# Patient Record
Sex: Male | Born: 1965 | Race: White | Hispanic: No | Marital: Married | Smoking: Current every day smoker
Health system: Southern US, Academic
[De-identification: ages and names within clinical notes are randomized; demographics above are authoritative.]

## PROBLEM LIST (undated history)

## (undated) DIAGNOSIS — E7800 Pure hypercholesterolemia, unspecified: Secondary | ICD-10-CM

## (undated) DIAGNOSIS — M4800 Spinal stenosis, site unspecified: Secondary | ICD-10-CM

## (undated) DIAGNOSIS — I10 Essential (primary) hypertension: Secondary | ICD-10-CM

## (undated) DIAGNOSIS — M129 Arthropathy, unspecified: Secondary | ICD-10-CM

## (undated) DIAGNOSIS — D66 Hereditary factor VIII deficiency: Secondary | ICD-10-CM

## (undated) HISTORY — DX: Essential (primary) hypertension: I10

## (undated) HISTORY — DX: Arthropathy, unspecified: M12.9

## (undated) HISTORY — DX: Pure hypercholesterolemia, unspecified: E78.00

## (undated) HISTORY — DX: Hereditary factor VIII deficiency (CMS HCC): D66

## (undated) HISTORY — DX: Spinal stenosis, site unspecified: M48.00

## (undated) HISTORY — PX: KNEE ARTHROSCOPY: SUR90

---

## 1998-06-29 DIAGNOSIS — C801 Malignant (primary) neoplasm, unspecified: Secondary | ICD-10-CM

## 1998-06-29 HISTORY — DX: Malignant (primary) neoplasm, unspecified (CMS HCC): C80.1

## 2000-06-15 ENCOUNTER — Other Ambulatory Visit (HOSPITAL_COMMUNITY): Payer: Self-pay | Admitting: Hematology & Oncology

## 2020-06-20 IMAGING — MR MRI CERVICAL SPINE WITHOUT CONTRAST
4 of 5 series · 24 of 48 positions shown · IV contrast (gadolinium)
Comparison: None.

﻿EXAM:  MRI CERVICAL SPINE WITHOUT CONTRAST
INDICATION: Posterior neck pain and headaches.
TECHNIQUE: Multiplanar, multisequential MRI of the cervical spine was performed without gadolinium contrast.

[Series 5: T2 · sagittal · 3.0mm · 0.75mm/px · 8 of 15 slices shown (1 of 2)]
[im 1/15]
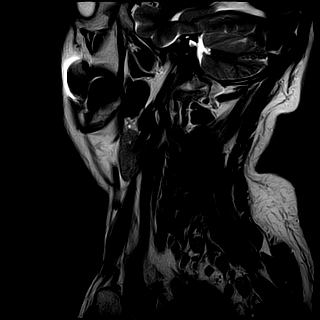
[im 3/15]
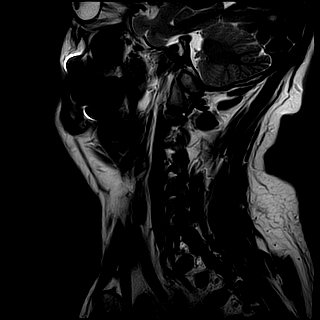
[im 5/15]
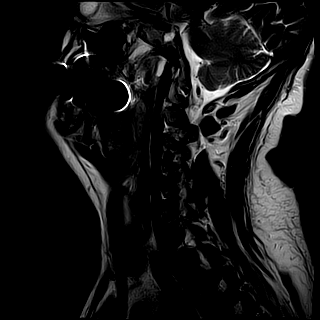
[im 7/15]
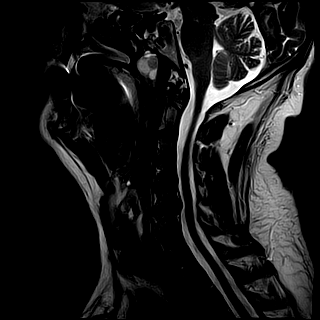
[im 9/15]
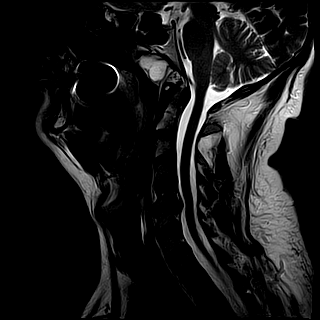
[im 11/15]
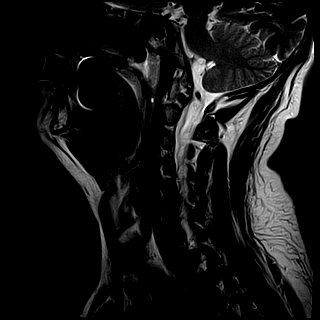
[im 13/15]
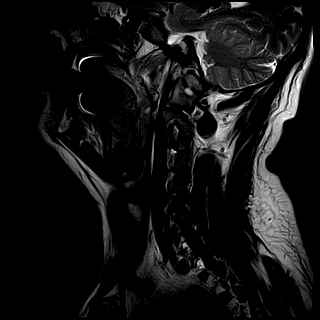
[im 15/15]
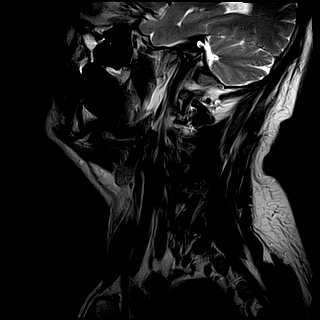

[Series 6: T1 · sagittal · 3.0mm · 0.47mm/px · 3 of 15 slices shown]
[im 2/15]
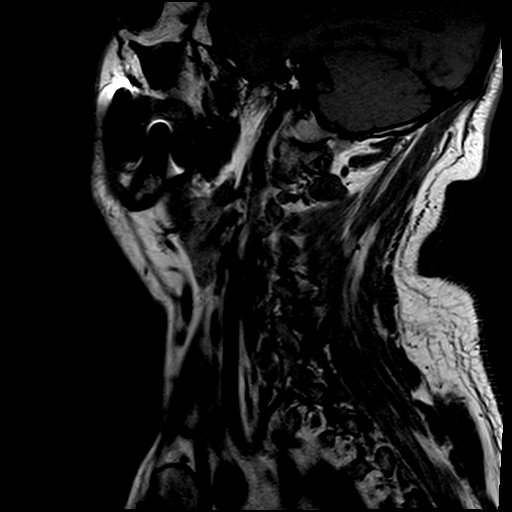
[im 8/15]
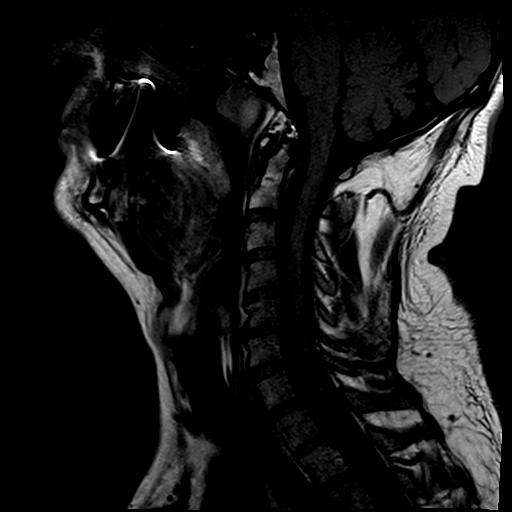
[im 13/15]
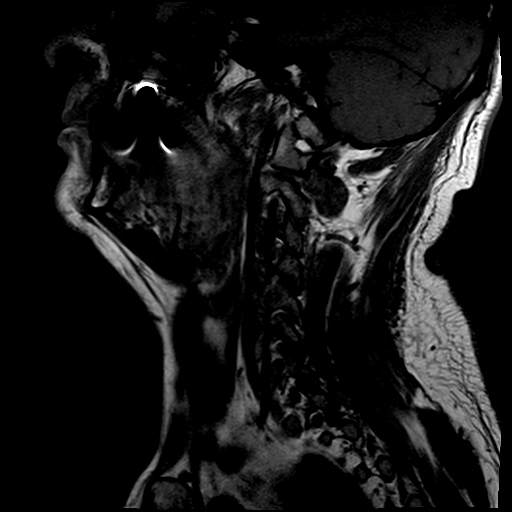

[Series 8: STIR · sagittal · 3.0mm · 0.47mm/px · 3 of 15 slices shown]
[im 2/15]
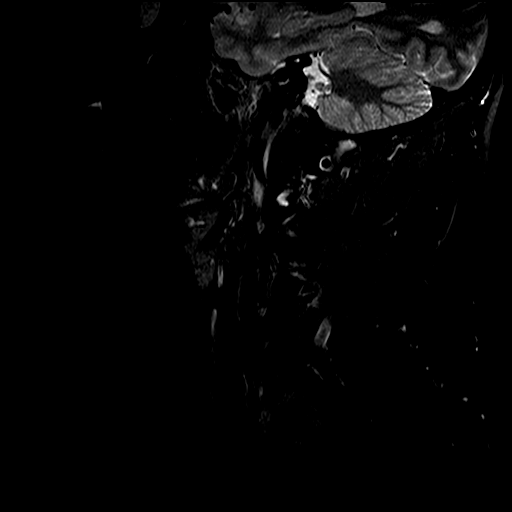
[im 8/15]
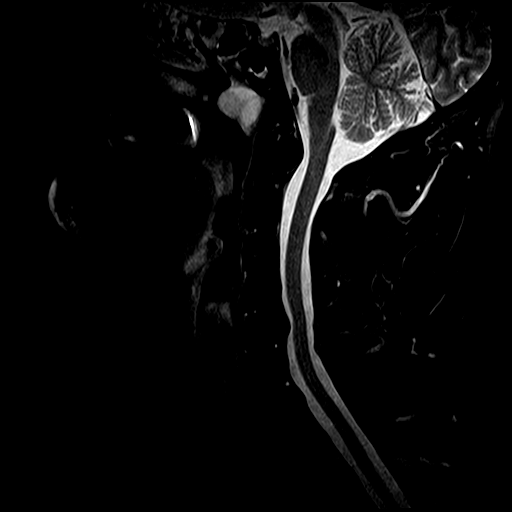
[im 13/15]
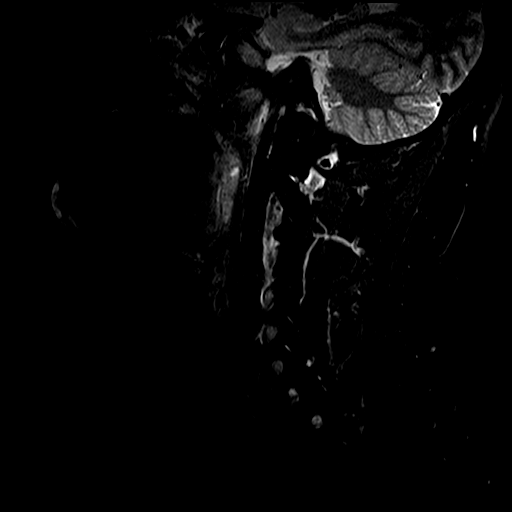

[Series 10: T2 · axial · 3.0mm · 0.39mm/px · z∈[-117,+4]mm · 10 of 18 slices shown (2 of 2)]
[im 1/18]
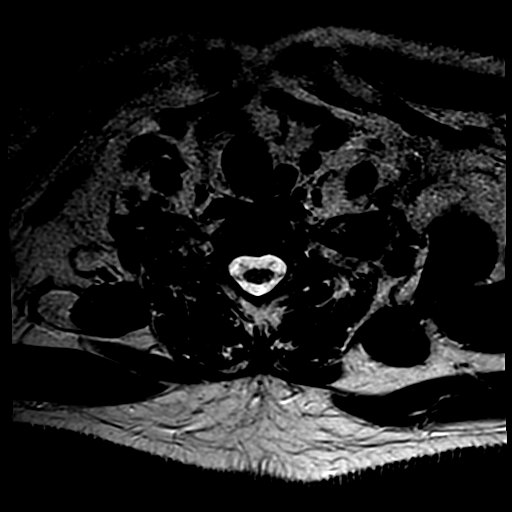
[im 2/18]
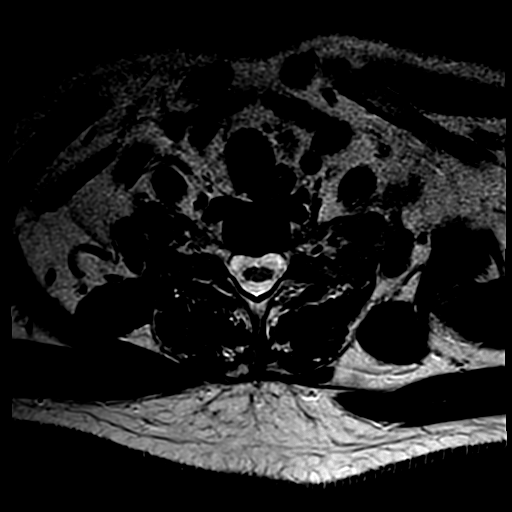
[im 4/18]
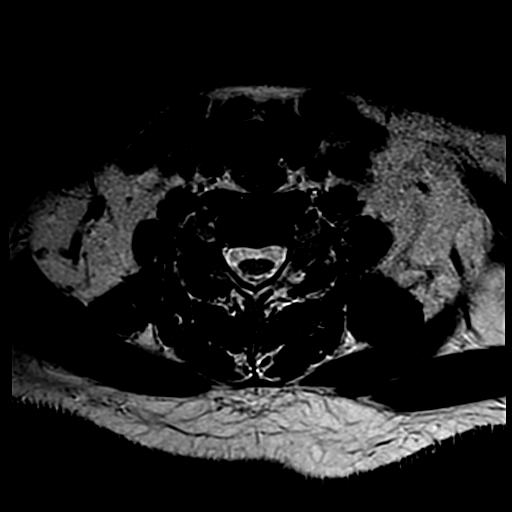
[im 6/18]
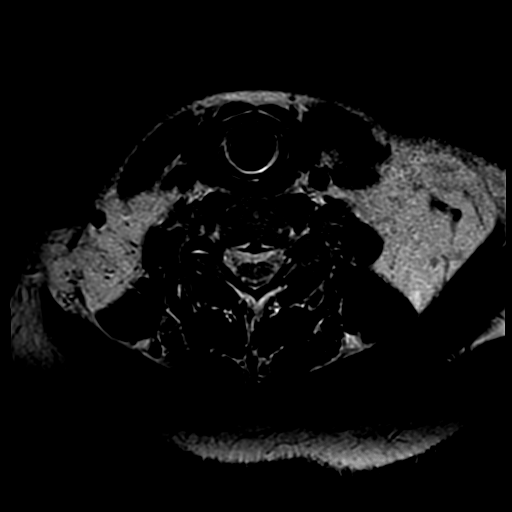
[im 7/18]
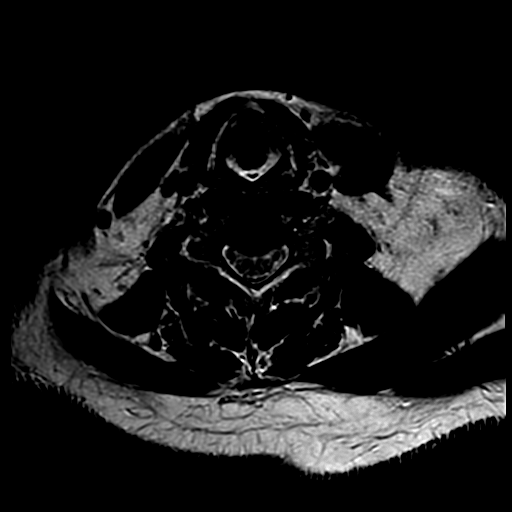
[im 9/18]
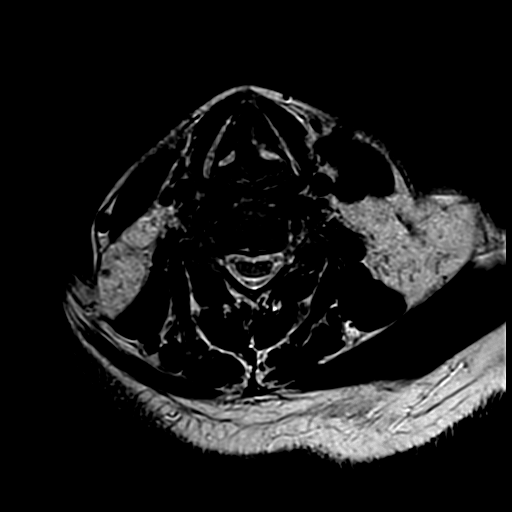
[im 11/18]
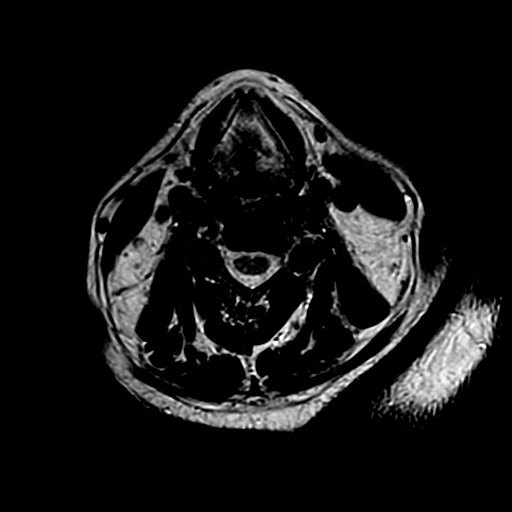
[im 12/18]
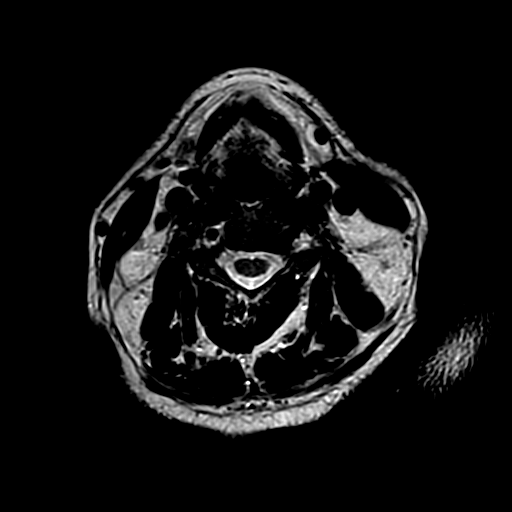
[im 14/18]
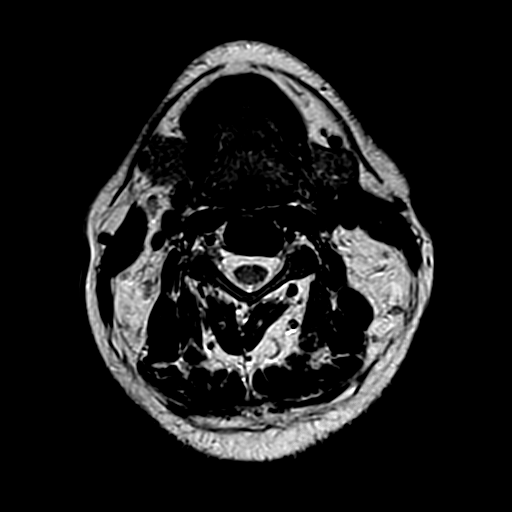
[im 16/18]
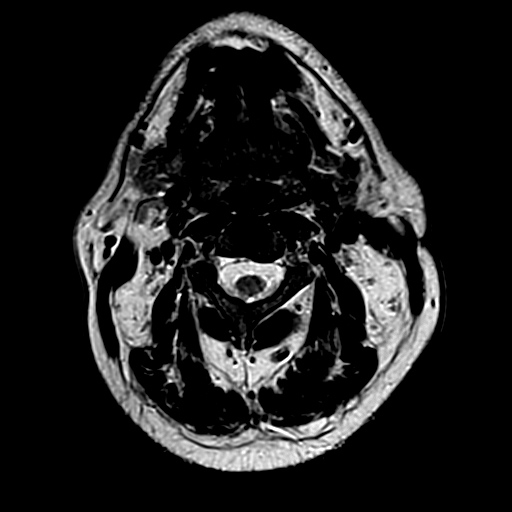

[24 of 48 positions shown; findings below may reference images not displayed]

FINDINGS: Vertebral bodies are normal in height, alignment and signal intensity.  There is no acute fracture or subluxation.  Visualized spinal cord is normal in signal intensity without evidence of compression at any level.  

C2-C3 level is unremarkable.

At C3-C4 level, there is mild left neural foraminal stenosis from facet and uncovertebral joint hypertrophy.  

At C4-C5 level, there is moderate to severe left neural foraminal stenosis from facet and uncovertebral joint hypertrophy. 

At C5-C6 level, there is a small broad-based central disc osteophyte complex partially effacing the ventral CSF.  There is moderate to severe right neural foraminal stenosis from uncovertebral joint hypertrophy. 

C6-C7, C7-T1 and paraspinal soft tissues are unremarkable.
IMPRESSION: 1. Small central disc osteophyte complex at C5-C6 level partially effacing the ventral CSF.

2. Multilevel neural foraminal stenosis as detailed above.

## 2021-07-02 ENCOUNTER — Other Ambulatory Visit (HOSPITAL_COMMUNITY): Payer: Self-pay | Admitting: NURSE PRACTITIONER

## 2021-07-02 DIAGNOSIS — R079 Chest pain, unspecified: Secondary | ICD-10-CM

## 2021-10-15 IMAGING — MR MRI KNEE RT W/O CONTRAST
4 of 5 series · 25 of 40 positions shown · non-contrast
Comparison: None.

﻿EXAM:  [id number]   MRI KNEE RT W/O CONTRAST
INDICATION: Right knee mass or cyst on the lateral side.
TECHNIQUE: Noncontrast multiplanar, multisequence MRI was performed.

[Series 6: PD fat-sat · sagittal · right · 3.0mm · 0.31mm/px · 8 of 30 slices shown (1 of 3)]
[im 1/30]
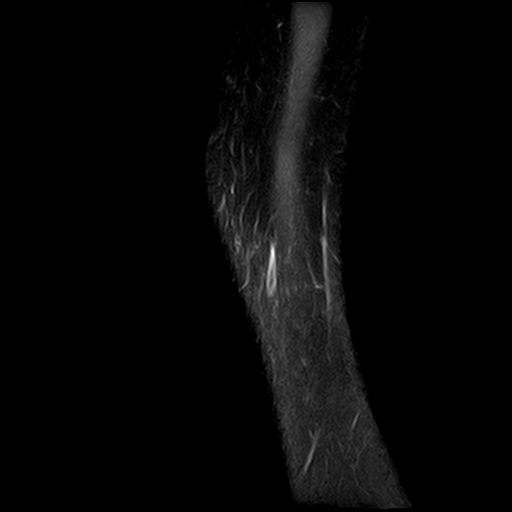
[im 5/30]
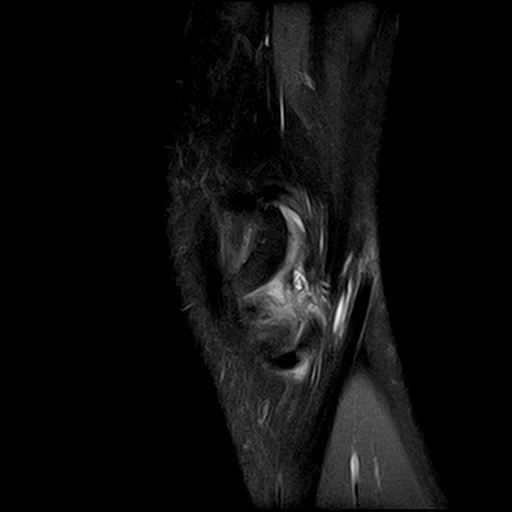
[im 9/30]
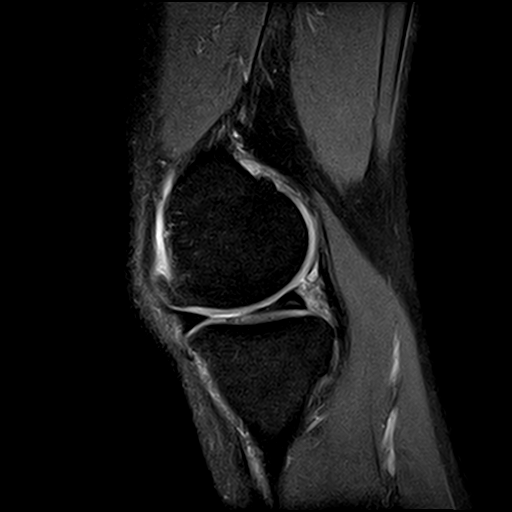
[im 13/30]
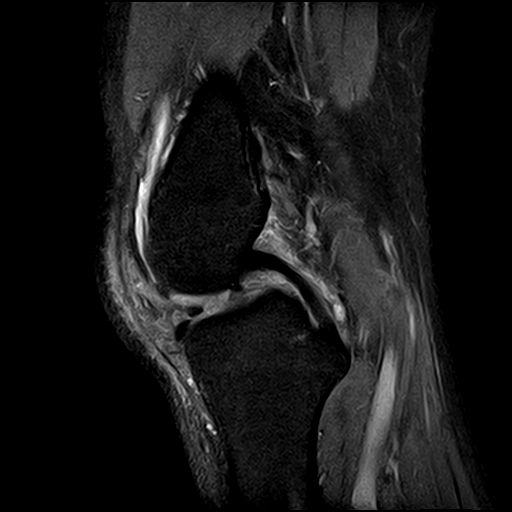
[im 17/30]
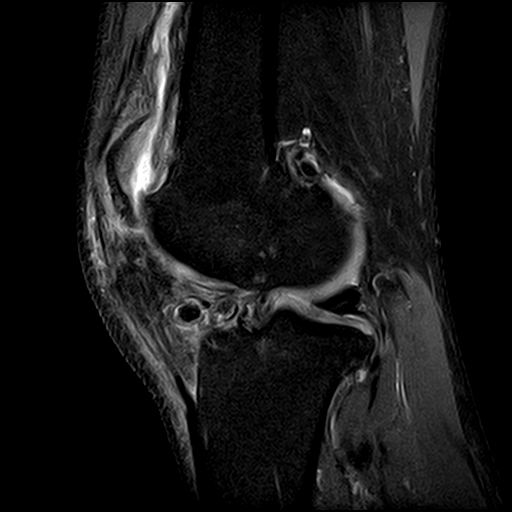
[im 21/30]
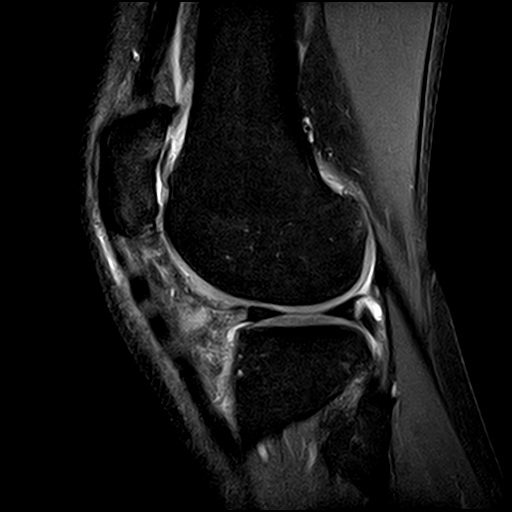
[im 25/30]
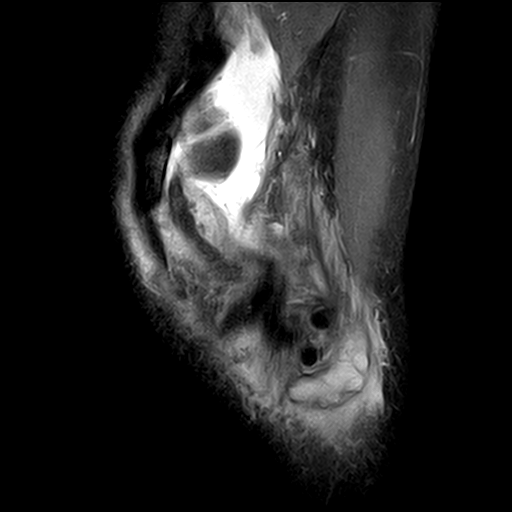
[im 30/30]
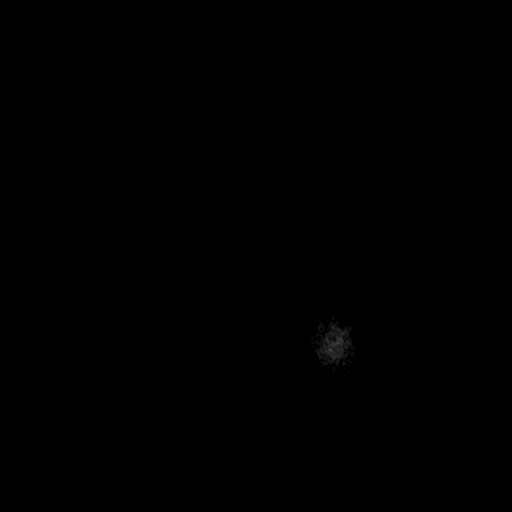

[Series 7: T1 · sagittal · right · 3.0mm · 0.31mm/px · 3 of 30 slices shown]
[im 5/30]
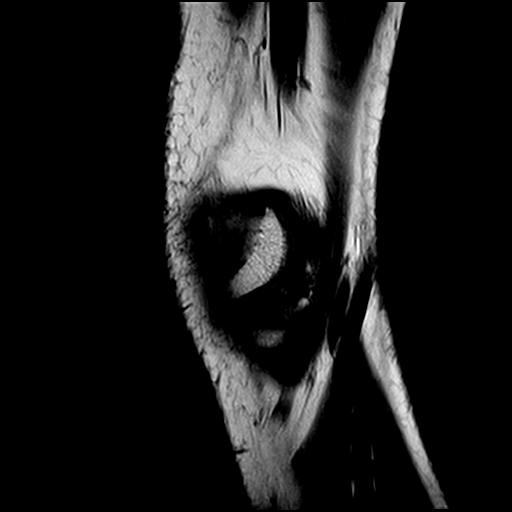
[im 17/30]
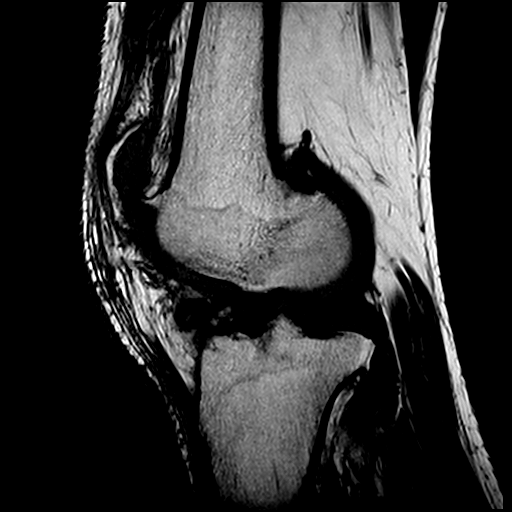
[im 25/30]
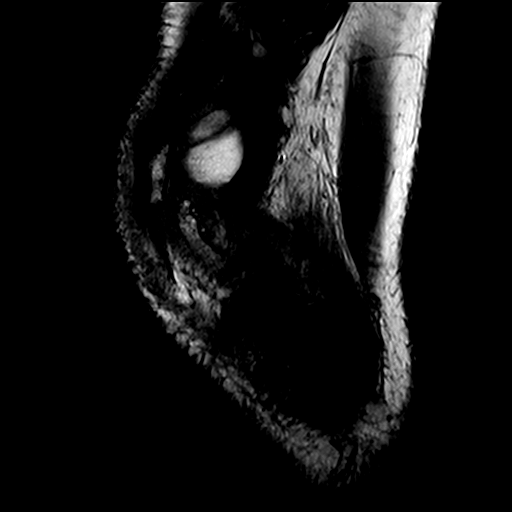

[Series 9: PD fat-sat · coronal · right · 3.0mm · 0.36mm/px · 8 of 27 slices shown (2 of 3)]
[im 1/27]
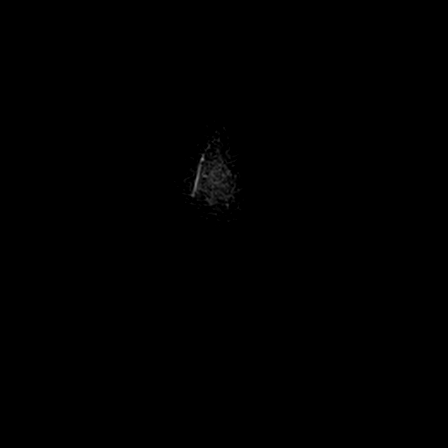
[im 4/27]
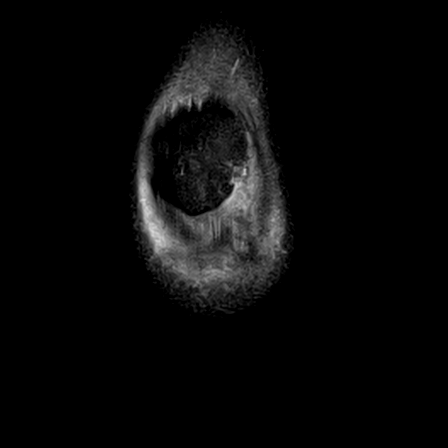
[im 8/27]
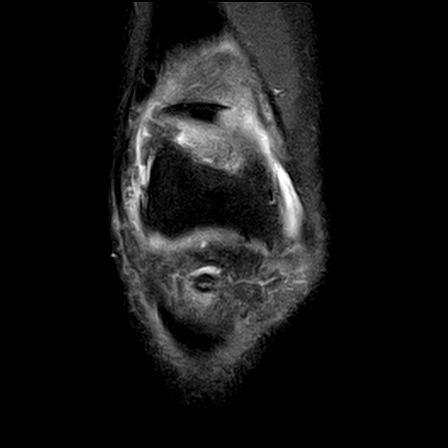
[im 12/27]
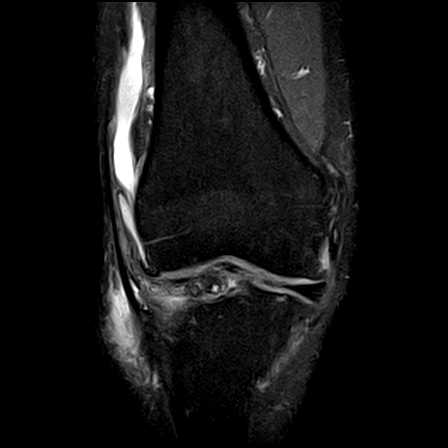
[im 15/27]
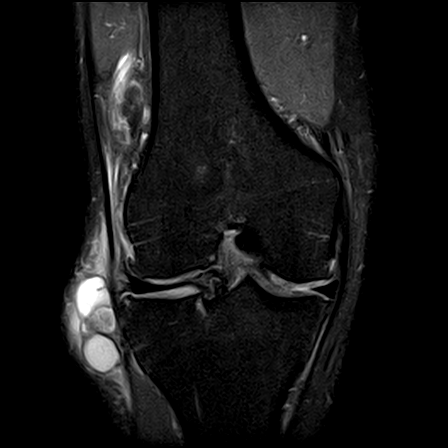
[im 19/27]
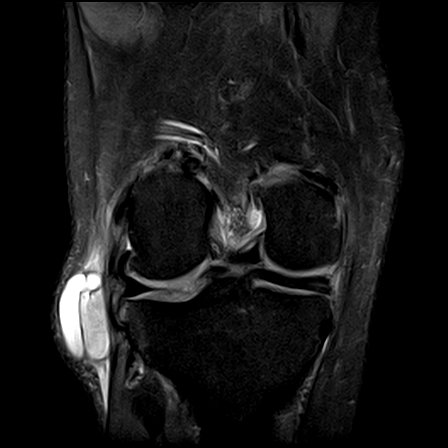
[im 23/27]
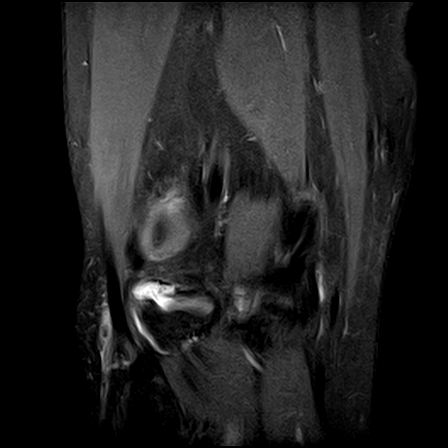
[im 27/27]
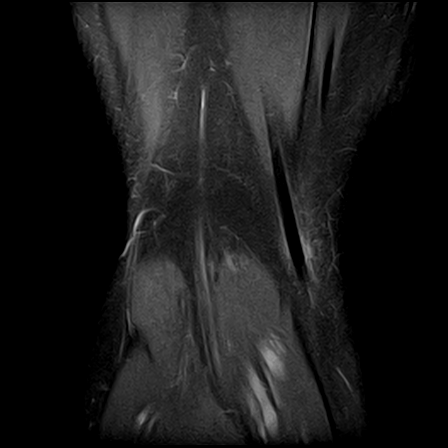

[Series 10: PD fat-sat · axial · right · 4.0mm · 0.59mm/px · z∈[-89,+19]mm · 6 of 30 slices shown (3 of 3)]
[im 1/30]
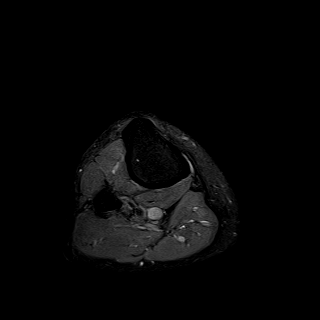
[im 5/30]
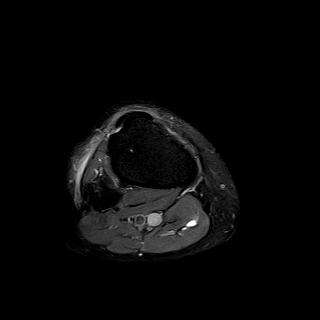
[im 9/30]
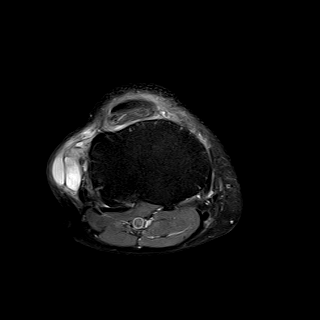
[im 13/30]
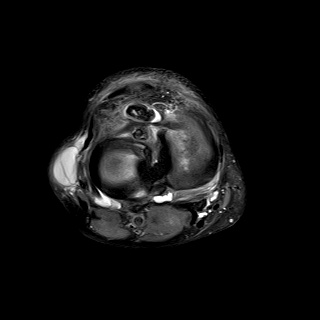
[im 17/30]
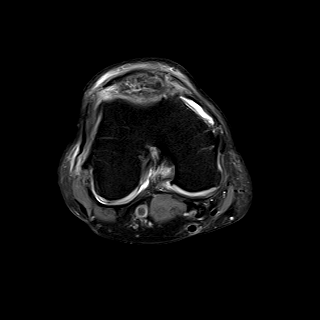
[im 25/30]
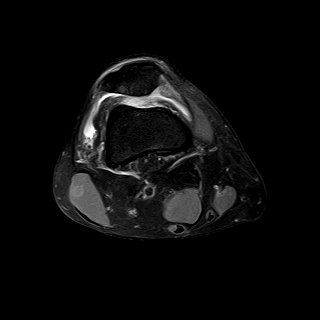

[25 of 40 positions shown; findings below may reference images not displayed]

FINDINGS: There appear to be multiple small osseous loose bodies suggesting synovial osteochondromatosis.  This could be confirmed with plain film or CT examination as indicated.  There is a small to moderate-sized joint effusion. 

There is a 4 cm multiloculated lateral cyst.  There is a small amount of hemorrhage or debris within some of the locules.  This may represent a ganglion cyst.

No definite meniscal tear is seen. The anterior and posterior cruciate ligaments appear intact.  The medial collateral ligament appears intact.  The lateral collateral ligament is partially obscured.  There is mild extensor tendinosis.

There are moderate degenerative changes. There is moderate chondromalacia patella.

No fracture, dislocation, or significant marrow signal alteration is seen.
IMPRESSION: 1. Synovial osteochondromatosis. 

2. Lateral ganglion cyst. 

3. Moderate osteoarthritis.

## 2021-10-30 ENCOUNTER — Other Ambulatory Visit (HOSPITAL_COMMUNITY): Payer: Self-pay | Admitting: NURSE PRACTITIONER

## 2021-10-30 DIAGNOSIS — D0007 Carcinoma in situ of tongue: Secondary | ICD-10-CM

## 2021-11-06 ENCOUNTER — Inpatient Hospital Stay (HOSPITAL_COMMUNITY)
Admission: RE | Admit: 2021-11-06 | Discharge: 2021-11-06 | Disposition: A | Discharge status: Home / Self Care | Payer: MEDICAID | Source: Ambulatory Visit

## 2021-11-06 ENCOUNTER — Inpatient Hospital Stay
Admission: RE | Admit: 2021-11-06 | Discharge: 2021-11-06 | Disposition: A | Discharge status: Home / Self Care | Payer: MEDICAID | Source: Ambulatory Visit | Attending: NURSE PRACTITIONER | Admitting: NURSE PRACTITIONER

## 2021-11-06 ENCOUNTER — Other Ambulatory Visit: Payer: Self-pay

## 2021-11-06 DIAGNOSIS — D0007 Carcinoma in situ of tongue: Secondary | ICD-10-CM | POA: Insufficient documentation

## 2021-11-10 ENCOUNTER — Other Ambulatory Visit: Payer: Self-pay

## 2022-02-09 ENCOUNTER — Inpatient Hospital Stay (HOSPITAL_COMMUNITY): Payer: MEDICAID

## 2022-02-09 ENCOUNTER — Observation Stay
Admission: EM | Admit: 2022-02-09 | Discharge: 2022-02-10 | Disposition: A | Discharge status: Home / Self Care | Payer: MEDICAID | Attending: Internal Medicine | Admitting: Internal Medicine

## 2022-02-09 ENCOUNTER — Observation Stay (HOSPITAL_COMMUNITY): Payer: MEDICAID | Admitting: Internal Medicine

## 2022-02-09 ENCOUNTER — Encounter (HOSPITAL_COMMUNITY): Payer: Self-pay

## 2022-02-09 ENCOUNTER — Other Ambulatory Visit: Payer: Self-pay

## 2022-02-09 DIAGNOSIS — R0789 Other chest pain: Principal | ICD-10-CM | POA: Insufficient documentation

## 2022-02-09 DIAGNOSIS — Z79899 Other long term (current) drug therapy: Secondary | ICD-10-CM | POA: Insufficient documentation

## 2022-02-09 DIAGNOSIS — Z7901 Long term (current) use of anticoagulants: Secondary | ICD-10-CM | POA: Insufficient documentation

## 2022-02-09 DIAGNOSIS — E785 Hyperlipidemia, unspecified: Secondary | ICD-10-CM

## 2022-02-09 DIAGNOSIS — F17210 Nicotine dependence, cigarettes, uncomplicated: Secondary | ICD-10-CM

## 2022-02-09 DIAGNOSIS — D6851 Activated protein C resistance: Secondary | ICD-10-CM | POA: Insufficient documentation

## 2022-02-09 DIAGNOSIS — R0600 Dyspnea, unspecified: Secondary | ICD-10-CM

## 2022-02-09 DIAGNOSIS — R61 Generalized hyperhidrosis: Secondary | ICD-10-CM

## 2022-02-09 DIAGNOSIS — Z8249 Family history of ischemic heart disease and other diseases of the circulatory system: Secondary | ICD-10-CM | POA: Insufficient documentation

## 2022-02-09 DIAGNOSIS — I10 Essential (primary) hypertension: Secondary | ICD-10-CM | POA: Insufficient documentation

## 2022-02-09 DIAGNOSIS — R079 Chest pain, unspecified: Secondary | ICD-10-CM | POA: Diagnosis present

## 2022-02-09 DIAGNOSIS — E7800 Pure hypercholesterolemia, unspecified: Secondary | ICD-10-CM | POA: Insufficient documentation

## 2022-02-09 LAB — COMPREHENSIVE METABOLIC PANEL, NON-FASTING
ALBUMIN/GLOBULIN RATIO: 1.7 — ABNORMAL HIGH (ref 0.8–1.4)
ALBUMIN: 4.8 g/dL (ref 3.5–5.7)
ALKALINE PHOSPHATASE: 43 U/L (ref 34–104)
ALT (SGPT): 20 U/L (ref 7–52)
ANION GAP: 8 mmol/L — ABNORMAL LOW (ref 10–20)
AST (SGOT): 19 U/L (ref 13–39)
BILIRUBIN TOTAL: 0.5 mg/dL (ref 0.3–1.2)
BUN/CREA RATIO: 12 (ref 6–22)
BUN: 11 mg/dL (ref 7–25)
CALCIUM, CORRECTED: 9.3 mg/dL (ref 8.9–10.8)
CALCIUM: 10.1 mg/dL (ref 8.6–10.3)
CHLORIDE: 101 mmol/L (ref 98–107)
CO2 TOTAL: 29 mmol/L (ref 21–31)
CREATININE: 0.95 mg/dL (ref 0.60–1.30)
ESTIMATED GFR: 95 mL/min/{1.73_m2} (ref >59)
GLOBULIN: 2.9 (ref 2.9–5.4)
GLUCOSE: 128 mg/dL — ABNORMAL HIGH (ref 74–109)
OSMOLALITY, CALCULATED: 277 mOsm/kg (ref 270–290)
POTASSIUM: 4 mmol/L (ref 3.5–5.1)
PROTEIN TOTAL: 7.7 g/dL (ref 6.4–8.9)
SODIUM: 138 mmol/L (ref 136–145)

## 2022-02-09 LAB — LIPID PANEL
CHOL/HDL RATIO: 6.3
CHOLESTEROL: 326 mg/dL — ABNORMAL HIGH (ref <200)
HDL CHOL: 52 mg/dL (ref 23–92)
LDL CALC: 194 mg/dL — ABNORMAL HIGH (ref 0–100)
TRIGLYCERIDES: 398 mg/dL — ABNORMAL HIGH (ref <=150)
VLDL CALC: 80 mg/dL — ABNORMAL HIGH (ref 0–50)

## 2022-02-09 LAB — CBC WITH DIFF
BASOPHIL #: 0.1 10*3/uL (ref 0.00–0.30)
BASOPHIL %: 1 % (ref 0–3)
EOSINOPHIL #: 0.1 10*3/uL (ref 0.00–0.80)
EOSINOPHIL %: 1 % (ref 0–7)
HCT: 48.7 % (ref 42.0–51.0)
HGB: 16.9 g/dL (ref 13.5–18.0)
LYMPHOCYTE #: 2.8 10*3/uL (ref 1.10–5.00)
LYMPHOCYTE %: 32 % (ref 25–45)
MCH: 32.6 pg — ABNORMAL HIGH (ref 27.0–32.0)
MCHC: 34.7 g/dL (ref 32.0–36.0)
MCV: 93.9 fL (ref 78.0–99.0)
MONOCYTE #: 0.7 10*3/uL (ref 0.00–1.30)
MONOCYTE %: 9 % (ref 0–12)
MPV: 7.3 fL — ABNORMAL LOW (ref 7.4–10.4)
NEUTROPHIL #: 5 10*3/uL (ref 1.80–8.40)
NEUTROPHIL %: 58 % (ref 40–76)
PLATELETS: 354 10*3/uL (ref 140–440)
RBC: 5.18 10*6/uL (ref 4.20–6.00)
RDW: 13.2 % (ref 11.6–14.8)
WBC: 8.7 10*3/uL (ref 4.0–10.5)
WBCS UNCORRECTED: 8.7 10*3/uL

## 2022-02-09 LAB — B-TYPE NATRIURETIC PEPTIDE: BNP: <25 pg/mL (ref 5–100)

## 2022-02-09 LAB — GRAY TOP TUBE

## 2022-02-09 LAB — MAGNESIUM: MAGNESIUM: 2 mg/dL (ref 1.9–2.7)

## 2022-02-09 LAB — PT/INR
INR: 1.1 (ref <=5.00)
PROTHROMBIN TIME: 12.7 seconds (ref 9.8–12.7)

## 2022-02-09 LAB — TROPONIN-I
TROPONIN I: 4 ng/L (ref <20)
TROPONIN I: 4 ng/L (ref <20)
TROPONIN I: 4 ng/L (ref <20)
TROPONIN I: 4 ng/L (ref <20)
TROPONIN I: 4 ng/L (ref <20)

## 2022-02-09 LAB — PTT (PARTIAL THROMBOPLASTIN TIME): APTT: 37.3 seconds — ABNORMAL HIGH (ref 26.0–36.0)

## 2022-02-09 LAB — THYROID STIMULATING HORMONE (SENSITIVE TSH): TSH: 1.204 u[IU]/mL (ref 0.450–5.330)

## 2022-02-09 MED ORDER — ONDANSETRON HCL (PF) 4 MG/2 ML INJECTION SOLUTION
4.0000 mg | Freq: Four times a day (QID) | INTRAMUSCULAR | Status: DC | PRN
Start: 2022-02-09 — End: 2022-02-10

## 2022-02-09 MED ORDER — CYCLOBENZAPRINE 10 MG TABLET
5.0000 mg | ORAL_TABLET | Freq: Two times a day (BID) | ORAL | Status: DC | PRN
Start: 2022-02-09 — End: 2022-02-10

## 2022-02-09 MED ORDER — ENALAPRIL MALEATE 5 MG TABLET
20.0000 mg | ORAL_TABLET | Freq: Every day | ORAL | Status: DC
Start: 2022-02-09 — End: 2022-02-10
  Administered 2022-02-09 – 2022-02-10 (×2): 20 mg via ORAL
  Filled 2022-02-09: qty 4

## 2022-02-09 MED ORDER — APIXABAN 5 MG TABLET
ORAL_TABLET | ORAL | Status: AC
Start: 2022-02-09 — End: 2022-02-09
  Filled 2022-02-09: qty 1

## 2022-02-09 MED ORDER — ASPIRIN 81 MG CHEWABLE TABLET
81.0000 mg | CHEWABLE_TABLET | Freq: Every day | ORAL | Status: DC
Start: 2022-02-10 — End: 2022-02-10
  Administered 2022-02-10: 81 mg via ORAL
  Filled 2022-02-09: qty 1

## 2022-02-09 MED ORDER — PANTOPRAZOLE 40 MG TABLET,DELAYED RELEASE
DELAYED_RELEASE_TABLET | ORAL | Status: AC
Start: 2022-02-09 — End: 2022-02-09
  Filled 2022-02-09: qty 1

## 2022-02-09 MED ORDER — ACETAMINOPHEN 325 MG TABLET
650.0000 mg | ORAL_TABLET | ORAL | Status: DC | PRN
Start: 2022-02-09 — End: 2022-02-10

## 2022-02-09 MED ORDER — ASPIRIN 81 MG CHEWABLE TABLET
324.0000 mg | CHEWABLE_TABLET | ORAL | Status: DC
Start: 2022-02-09 — End: 2022-02-09

## 2022-02-09 MED ORDER — PREGABALIN 150 MG CAPSULE
ORAL_CAPSULE | ORAL | Status: AC
Start: 2022-02-09 — End: 2022-02-09
  Filled 2022-02-09: qty 1

## 2022-02-09 MED ORDER — NICOTINE 14 MG/24 HR DAILY TRANSDERMAL PATCH
14.0000 mg | MEDICATED_PATCH | Freq: Every day | TRANSDERMAL | Status: DC
Start: 2022-02-09 — End: 2022-02-10
  Administered 2022-02-09 – 2022-02-10 (×2): 14 mg via TRANSDERMAL
  Filled 2022-02-09 (×3): qty 1

## 2022-02-09 MED ORDER — ASPIRIN 81 MG CHEWABLE TABLET
CHEWABLE_TABLET | ORAL | Status: AC
Start: 2022-02-09 — End: 2022-02-09
  Filled 2022-02-09: qty 4

## 2022-02-09 MED ORDER — APIXABAN 5 MG TABLET
5.0000 mg | ORAL_TABLET | Freq: Every day | ORAL | Status: DC
Start: 2022-02-09 — End: 2022-02-10
  Administered 2022-02-09 – 2022-02-10 (×2): 5 mg via ORAL
  Filled 2022-02-09: qty 1

## 2022-02-09 MED ORDER — MORPHINE 2 MG/ML INJECTION WRAPPER
2.0000 mg | INJECTION | INTRAMUSCULAR | Status: DC | PRN
Start: 2022-02-09 — End: 2022-02-10

## 2022-02-09 MED ORDER — ASPIRIN 81 MG CHEWABLE TABLET
162.0000 mg | CHEWABLE_TABLET | ORAL | Status: AC
Start: 2022-02-09 — End: 2022-02-09
  Administered 2022-02-09: 162 mg via ORAL

## 2022-02-09 MED ORDER — PANTOPRAZOLE 40 MG TABLET,DELAYED RELEASE
40.0000 mg | DELAYED_RELEASE_TABLET | Freq: Every day | ORAL | Status: DC
Start: 2022-02-09 — End: 2022-02-10
  Administered 2022-02-09 – 2022-02-10 (×2): 40 mg via ORAL
  Filled 2022-02-09: qty 1

## 2022-02-09 MED ORDER — PREGABALIN 150 MG CAPSULE
150.0000 mg | ORAL_CAPSULE | Freq: Two times a day (BID) | ORAL | Status: DC
Start: 2022-02-09 — End: 2022-02-10
  Administered 2022-02-09 – 2022-02-10 (×2): 150 mg via ORAL
  Filled 2022-02-09: qty 1

## 2022-02-09 MED ORDER — NITROGLYCERIN 2 % TRANSDERMAL OINTMENT - PACKET
TOPICAL_OINTMENT | TRANSDERMAL | Status: AC
Start: 2022-02-09 — End: 2022-02-09
  Filled 2022-02-09: qty 1

## 2022-02-09 MED ORDER — ENALAPRIL MALEATE 5 MG TABLET
ORAL_TABLET | ORAL | Status: AC
Start: 2022-02-09 — End: 2022-02-09
  Filled 2022-02-09: qty 4

## 2022-02-09 MED ORDER — NITROGLYCERIN 2 % TRANSDERMAL OINTMENT - PACKET
1.0000 [in_us] | TOPICAL_OINTMENT | TRANSDERMAL | Status: AC
Start: 2022-02-09 — End: 2022-02-09
  Administered 2022-02-09: 1 [in_us] via TOPICAL

## 2022-02-09 NOTE — ED Triage Notes (Signed)
Was awakened by chest pain at 0330 today. States she was covered in sweat. States he felt like heartbeat was irregular and coming out of his chest. Was short of breath.

## 2022-02-09 NOTE — ED APP Handoff Note (Signed)
[facility]  Emergency Department  Provider in Triage Note    Name: Antonio Shields  Age: 56 y.o.  Gender: male     Subjective:   Antonio Shields is a 57 y.o. male who presents with complaint of Chest Pain   .  Patient here with chest pain, started in the middle of the night. Associated symptoms of diaphoresis, palpitations, dyspnea, dizziness. Denies any cardiac history or surgery.     Objective:   Filed Vitals:    02/09/22 1253   BP: (!) 139/98   Pulse: 77   Resp: 19   Temp: 36.6 C (97.9 F)   SpO2: 100%      Focused Physical Exam shows patient sitting in chair, no distress noted. BP slightly elevated 139/98    Assessment:  A medical screening exam was completed.  This patient is a 56 y.o. male with initial findings showing chest pain    Plan:  Please see initial orders and work-up below.  This is to be continued with full evaluation in the main Emergency Department.     No current facility-administered medications for this encounter.     No results found for this or any previous visit (from the past 24 hour(s)).     Hewitt Shorts, FNP-BC  02/09/2022, 12:52

## 2022-02-09 NOTE — H&P (Signed)
[facility]  Hospitalist  History and Physical    Barret, Esquivel  Date of Admission:  02/09/2022  Date of Birth:  1966/03/27  PCP: Haze Rushing, NP      REASON FOR ADMISSION: Chest Pain    HPI: Pt arrives in the Er today after having chest pain on/off x 3 months. He states he had moderate chest pressure that woke him up this AM with diaphoresis and left arm numbness that lasted about 10 min. Trops are 4, 4 and 4. He states chest pressure has happened with and without exertion. Nothing helps or makes worse. He denies any other sx. His father did have MI at 59, also. He has history of HTN, hyperlipidemia and Factor V disorder and does smoke 1 pk daily x 40 years. He is on chronic Eliquis.       ROS: Full review of systems performed negative with exception of pertinent negatives and positives mentioned in HPI.      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    Congenital factor VIII disorder (CMS HCC)     High cholesterol     HTN (hypertension)     Spinal stenosis              Past Surgical History:   Procedure Laterality Date    KNEE ARTHROSCOPY Right              Medications Prior to Admission       Prescriptions    aspirin (ECOTRIN) 81 mg Oral Tablet, Delayed Release (E.C.)    Take 1 Tablet (81 mg total) by mouth Once a day    cyclobenzaprine (FLEXERIL) 5 mg Oral Tablet    Twice per day as needed    ELIQUIS 5 mg Oral Tablet    Take 1 Tablet (5 mg total) by mouth Once a day    enalapril (VASOTEC) 20 mg Oral Tablet    Take 1 Tablet (20 mg total) by mouth Once a day    fenofibrate micronized (LOFIBRA) 134 mg Oral Capsule    Take 1 Capsule (134 mg total) by mouth Once a day    fluticasone propionate (FLONASE) 50 mcg/actuation Nasal Spray, Suspension    Administer 2 Sprays into each nostril Twice per day as needed    multivit-minerals/folic acid (CENTRUM ADULTS ORAL)    Take 1 Tablet by mouth Once a day    pantoprazole (PROTONIX) 40 mg Oral  Tablet, Delayed Release (E.C.)    Take 1 Tablet (40 mg total) by mouth Once a day    pregabalin (LYRICA) 150 mg Oral Capsule    Take 1 Capsule (150 mg total) by mouth Twice daily    vitamin B complex Oral Tablet    Take 1 Tablet by mouth Once a day              Allergies   Allergen Reactions    Ciprofloxacin Rash    Flagyl [Metronidazole] Hives/ Urticaria       Social History     Tobacco Use    Smoking status: Every Day     Packs/day: 1.00     Types: Cigarettes    Smokeless tobacco: Never   Substance Use Topics    Alcohol use: Never    Drug use: Never       Family History:      PHYSICAL EXAM:  Physical Exam  HENT:      Mouth/Throat:      Mouth: Mucous membranes are moist.   Eyes:      Pupils: Pupils are equal, round, and reactive to light.   Cardiovascular:      Rate and Rhythm: Normal rate and regular rhythm.      Pulses: Normal pulses.      Heart sounds: Normal heart sounds.   Pulmonary:      Effort: Pulmonary effort is normal.      Breath sounds: Normal breath sounds.   Abdominal:      General: Bowel sounds are normal.      Palpations: Abdomen is soft.   Skin:     General: Skin is warm and dry.   Neurological:      Mental Status: He is alert and oriented to person, place, and time.         VITALS:  Temperature: 36.6 C (97.9 F)  Heart Rate: 66  BP (Non-Invasive): (!) 141/95  Respiratory Rate: (!) 10  SpO2: 97 %        Labs:    I have reviewed all lab results.    Imaging Studies:    XR CHEST PA AND LATERAL   Final Result   NO ACUTE FINDINGS.         Radiologist location ID: [id number]             Assessment/Plan:       Active Hospital Problems   (*Primary Problem)    Diagnosis    *Chest pain    Factor V Leiden (CMS HCC)     Chronic    HTN (hypertension)     Chronic    High cholesterol     Chronic     -Chest pain  Consult cardiology. Nitro and Mso4 as needed. Echo ordered and tele monitor.     -Factor V Leiden  Continue home Eliquis     -HTN  Resume home BP meds    -Hight cholesterol  Resume statin    The  Hospitalist personally evaluated and examined the patient in conjunction with the MLP and agree with the assessments, treatment plan and disposition of the patient as recorded by the [facility].       Angelina Pih, NP-C

## 2022-02-09 NOTE — ED Nurses Note (Signed)
Patient received to room, placed on monitor, assessment and history completed. Call light in hand, encouraged to call for any needs. Awaiting providers orders at this time.

## 2022-02-09 NOTE — ED Nurses Note (Signed)
Report called to 2W at this time.

## 2022-02-09 NOTE — ED Provider Notes (Signed)
[facility]  [place] [id number]  [phone]    Triage:  Chest Pain     Most Recent Vitals    Flowsheet Row ED from 02/09/2022 in [facility]   Temperature 36.6 C (97.9 F) filed at... 02/09/2022 1253   Heart Rate 77 filed at... 02/09/2022 1253   Respiratory Rate 19 filed at... 02/09/2022 1253   BP (Non-Invasive) 139/98 filed at... 02/09/2022 1253   SpO2 100 % filed at... 02/09/2022 1253   Height 1.803 m (_0 ) filed at... 02/09/2022 1253   Weight 88.5 kg (195 lb) filed at... 02/09/2022 1253   BMI (Calculated) 27.25 filed at... 02/09/2022 1253   BSA (Calculated) 2.1 filed at... 02/09/2022 1253        HPI:  Antonio Shields is a 56 y.o. male p/w intermittent chest pain.  Patient reports he is had intermittent chest pain for the past few weeks.  He reports this has been slightly increasing in intensity.  Last night he had an episode of significant chest pain pounding profusely diaphoretic.  Patient with no vomiting.  No diarrhea.  No fevers.  Symptoms resolved.  Patient was wondering what to do.  Had a discussion with wiped this morning.  The came to hospital get checked out.  He reports he is never had a stress test or catheterization.  He is a retired Teacher, early years/pre.  Patient has a history of hyperlipidemia.  Otherwise no significant known cardiac disease father does have a history of early MI.  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    Pertinent Exam  Filed Vitals:    02/09/22 1253   BP: (!) 139/98   Pulse: 77   Resp: 19   Temp: 36.6 C (97.9 F)   SpO2: 100%     AOx3, CN intact, EOMI  RRR, no r/m/g  CTAB, no resp distress  Abd s, nt, nd, no peritoneal signs  Moving all extremities, no obvious deformities  No rashes or lesions      Course    Medical Decision Making  Impression: [name] presents with intermittent and worsening chest pain    Plan: The following tests, imaging and medications will be ordered to investigate/rule out and evalaute [facility] chief complaint while pt is in  the emergency department.  Vitals will be monitored, and pt will be rechecked in the ED.    Orders Placed This Encounter    XR CHEST PA AND LATERAL    THYROID STIMULATING HORMONE (SENSITIVE TSH)    PTT (PARTIAL THROMBOPLASTIN TIME)    PT/INR    MAGNESIUM    COMPREHENSIVE METABOLIC PANEL, NON-FASTING    CBC/DIFF    B-TYPE NATRIURETIC PEPTIDE    CBC WITH DIFF    EXTRA TUBES    GRAY TOP TUBE    TROPONIN NOW    TROPONIN IN ONE HOUR    TROPONIN IN THREE HOURS    ECG 12 LEAD    aspirin chewable tablet 162 mg     Labs Ordered/Reviewed   PTT (PARTIAL THROMBOPLASTIN TIME) - Abnormal; Notable for the following components:       Result Value    APTT 37.3 (*)     All other components within normal limits   COMPREHENSIVE METABOLIC PANEL, NON-FASTING - Abnormal; Notable for the following components:    ANION GAP 8 (*)     GLUCOSE 128 (*)     ALBUMIN/GLOBULIN RATIO 1.7 (*)     All other components within normal limits    Narrative:     Estimated Glomerular Filtration Rate (eGFR) is calculated using the CKD-EPI (2021) equation, intended for patients 71  years of age and older. If gender is not documented or "unknown", there will be no eGFR calculation.   CBC WITH DIFF - Abnormal; Notable for the following components:    MCH 32.6 (*)     MPV 7.3 (*)     All other components within normal limits   THYROID STIMULATING HORMONE (SENSITIVE TSH) - Normal   PT/INR - Normal    Narrative:     INR OF 2.0-3.0  RECOMMENDED FOR: PROPHYLAXIS/TREATMENT OF VENEOUS THROMBOSIS, PULMONARY EMBOLISM, PREVENTION OF SYSTEMIC EMBOLISM FROM ATRIAL FIBRILATION, MYOCARDIAL INFARCTION.    INR OF 2.5-3.5  RECOMMENDED FOR MECHANICAL PROSTHETIC HEART VALVES, RECURRENT SYSTEMIC EMBOLISM, RECURRENT MYOCARDIAL INFARCTION.     MAGNESIUM - Normal   B-TYPE NATRIURETIC PEPTIDE - Normal    Narrative:                                 Class 1: 101-250 pg/mL                              Class 2: 251-550 pg/mL                              Class 3: 551-900 pg/mL                              Class 4: >901 pg/mL     The [employer] Association has developed a four-stage functional classification system for CHF that is based on a subjective interpretation of the severity of a patient's clinical signs and symptoms.    Class 1 - Patients have no limitations on physical activity and have no symptoms with ordinary physical activity.    Class 2 - Patients have a slight limitation of physical activity and have symptoms with ordinary physical activity.    Class 3 - Patients have a marked limitation of physical activity and have symptoms with less than ordinary  physical activity, but not at rest.    Class 4 - Patients are unable to perform any physical activity without discomfort.   TROPONIN-I - Normal   CBC/DIFF    Narrative:     The following orders were created for panel order CBC/DIFF.  Procedure                               Abnormality         Status                     ---------                               -----------         ------                     CBC WITH RKYH[[id number]]                Abnormal             Final result                 Please view results for these tests on the individual orders.   EXTRA TUBES    Narrative:     The following orders were created for panel order EXTRA TUBES.  Procedure                               Abnormality         Status                     ---------                               -----------         ------                     Feliberto Gottron [phone]                                    In process                   Please view results for these tests on the individual orders.   GRAY TOP TUBE   TROPONIN-I   TROPONIN-I       All labs reviewed during ED course and at 14:45.    I have reviewed recent medical records    Radiographic Imaging:   XR CHEST PA AND LATERAL   Final Result   NO ACUTE FINDINGS.         Radiologist location ID: [id number]             EKG:  Normal sinus rhythm rate 83 no acute ST T wave changes    ED Recheck: Patient doing well. Vitals stable.  Pain controlled.     Consults or discussions with external providers:  Discussed with hospitalist    Procedures: IV, Labs, Imaging    Dispo/Summary: Course Update:  Patient with intermittent chest pain, worsening over the past few weeks.  Significantly worse this week.  Patient with peak episode last night.  Troponin normal EKG nonischemic.  He has not had any kind of cardiac workup in the past.  Patient and family concerned.  Will admit for accelerated workup.    FINAL DISPO: ADMITTED TO hospitalist  Disposition: Admitted  Clinical Impression:     Clinical Impression   None     Future Appointments scheduled in [place]:   No future appointments.  Kaleen Mask, MD 02/09/2022, 14:46  Attending Physician  Emergency Medicine

## 2022-02-10 ENCOUNTER — Observation Stay (HOSPITAL_COMMUNITY): Payer: MEDICAID

## 2022-02-10 ENCOUNTER — Other Ambulatory Visit: Payer: Self-pay

## 2022-02-10 LAB — CBC WITH DIFF
BASOPHIL #: 0 10*3/uL (ref 0.00–0.30)
BASOPHIL %: 0 % (ref 0–3)
EOSINOPHIL #: 0.2 10*3/uL (ref 0.00–0.80)
EOSINOPHIL %: 2 % (ref 0–7)
HCT: 44.3 % (ref 42.0–51.0)
HGB: 15.2 g/dL (ref 13.5–18.0)
LYMPHOCYTE #: 3.1 10*3/uL (ref 1.10–5.00)
LYMPHOCYTE %: 42 % (ref 25–45)
MCH: 32.3 pg — ABNORMAL HIGH (ref 27.0–32.0)
MCHC: 34.3 g/dL (ref 32.0–36.0)
MCV: 94.2 fL (ref 78.0–99.0)
MONOCYTE #: 0.7 10*3/uL (ref 0.00–1.30)
MONOCYTE %: 10 % (ref 0–12)
MPV: 7.6 fL (ref 7.4–10.4)
NEUTROPHIL #: 3.4 10*3/uL (ref 1.80–8.40)
NEUTROPHIL %: 46 % (ref 40–76)
PLATELETS: 294 10*3/uL (ref 140–440)
RBC: 4.7 10*6/uL (ref 4.20–6.00)
RDW: 13.5 % (ref 11.6–14.8)
WBC: 7.3 10*3/uL (ref 4.0–10.5)
WBCS UNCORRECTED: 7.3 10*3/uL

## 2022-02-10 LAB — BASIC METABOLIC PANEL
ANION GAP: 7 mmol/L — ABNORMAL LOW (ref 10–20)
BUN/CREA RATIO: 18 (ref 6–22)
BUN: 15 mg/dL (ref 7–25)
CALCIUM: 9.2 mg/dL (ref 8.6–10.3)
CHLORIDE: 104 mmol/L (ref 98–107)
CO2 TOTAL: 28 mmol/L (ref 21–31)
CREATININE: 0.84 mg/dL (ref 0.60–1.30)
ESTIMATED GFR: 103 mL/min/{1.73_m2} (ref >59)
GLUCOSE: 99 mg/dL (ref 74–109)
OSMOLALITY, CALCULATED: 278 mOsm/kg (ref 270–290)
POTASSIUM: 4.3 mmol/L (ref 3.5–5.1)
SODIUM: 139 mmol/L (ref 136–145)

## 2022-02-10 LAB — ECG 12 LEAD
Atrial Rate: 83 {beats}/min
Atrial Rate: 69 {beats}/min
Calculated P Axis: 22 degrees
Calculated P Axis: 50 degrees
Calculated R Axis: 90 degrees
Calculated R Axis: 30 degrees
Calculated T Axis: 34 degrees
Calculated T Axis: 36 degrees
PR Interval: 130 ms
PR Interval: 134 ms
QRS Duration: 94 ms
QRS Duration: 92 ms
QT Interval: 354 ms
QT Interval: 372 ms
QTC Calculation: 415 ms
QTC Calculation: 398 ms
Ventricular rate: 83 {beats}/min
Ventricular rate: 69 {beats}/min

## 2022-02-10 LAB — MAGNESIUM: MAGNESIUM: 2 mg/dL (ref 1.9–2.7)

## 2022-02-10 MED ORDER — REGADENOSON 0.4 MG/5 ML INTRAVENOUS SYRINGE
INJECTION | INTRAVENOUS | Status: DC
Start: 2022-02-10 — End: 2022-02-10
  Filled 2022-02-10: qty 5

## 2022-02-10 MED ORDER — REGADENOSON 0.4 MG/5 ML INTRAVENOUS SYRINGE
0.4000 mg | INJECTION | INTRAVENOUS | Status: AC
Start: 2022-02-10 — End: 2022-02-10
  Administered 2022-02-10: 0.4 mg via INTRAVENOUS

## 2022-02-10 NOTE — Care Plan (Signed)
Pt admitted with chest pain. Cardiac enzymes negative. Pt for stress test today. Cardiology consulted   Problem: Adult Inpatient Plan of Care  Goal: Plan of Care Review  Outcome: Ongoing (see interventions/notes)  Goal: Patient-Specific Goal (Individualized)  Outcome: Ongoing (see interventions/notes)  Goal: Absence of Hospital-Acquired Illness or Injury  Outcome: Ongoing (see interventions/notes)  Goal: Optimal Comfort and Wellbeing  Outcome: Ongoing (see interventions/notes)  Goal: Rounds/Family Conference  Outcome: Ongoing (see interventions/notes)     Problem: Chest Pain  Goal: Resolution of Chest Pain Symptoms  Outcome: Ongoing (see interventions/notes)     Problem: Adult Inpatient Plan of Care  Goal: Plan of Care Review  Outcome: Ongoing (see interventions/notes)  Goal: Patient-Specific Goal (Individualized)  Outcome: Ongoing (see interventions/notes)  Goal: Absence of Hospital-Acquired Illness or Injury  Outcome: Ongoing (see interventions/notes)  Goal: Optimal Comfort and Wellbeing  Outcome: Ongoing (see interventions/notes)  Goal: Rounds/Family Conference  Outcome: Ongoing (see interventions/notes)     Problem: Chest Pain  Goal: Resolution of Chest Pain Symptoms  Outcome: Ongoing (see interventions/notes)

## 2022-02-10 NOTE — Consults (Signed)
[place]    Date of Service:  02/10/2022  Antonio Shields   56 y.o. male  Date of Admission:  02/09/2022  Date of Birth:  [birth year]  0     Reason for Consultation:  Chest pain    Problem List:  Active Hospital Problems   (*Primary Problem)    Diagnosis    *Chest pain    Factor V Leiden (CMS HCC)     Chronic    HTN (hypertension)     Chronic    High cholesterol     Chronic       History of Present Illness:  Antonio Shields is a 56 y.o. White male who presents with chest pain off and on for the last few months.  The pain is like pressure type sensation and this time it woke him up with diaphoresis and some numbness of the left arm.  His initial troponins were normal.  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    He has 50 pack his history of smoking and smokes about a pack of seconds at a.    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 He has been smoking an average of 1 pack per day. He has never used smokeless tobacco. He reports that he does not drink alcohol and does not use drugs.     REVIEW OF SYSTEMS:  All systems have been reviewed and found to be negative except for as stated above in the history of present illness.       Constitutional - no appetite or weight changes, no fatigue, no fevers, chills, or night sweats  Respiratory - no dyspnea or cough  Cardiovascular - chest pain as mentioned above  Gastrointestinal - no nausea, vomiting, diarrhea, or  constipation, no dyspepsia  Skin - no rashes, color changes, or lesions  Musculoskeletal - no arthralgias or myalgias  Genitourinary - no urinary frequency or dysuria, no genital discharge  Neurologic - no vision or hearing changes, no weakness, no parasthesias   Psychiatric - mood has been appropriate      Allergies   Allergen Reactions    Ciprofloxacin Rash    Flagyl [Metronidazole] Hives/ Urticaria       Medications:  Medications Prior to Admission       Prescriptions    aspirin (ECOTRIN) 81 mg Oral Tablet, Delayed Release (E.C.)    Take 1 Tablet (81 mg total) by mouth Once a day    cyclobenzaprine (FLEXERIL) 5 mg Oral Tablet    Twice per day as needed    ELIQUIS 5 mg Oral Tablet    Take 1 Tablet (5 mg total) by mouth Once a day    enalapril (VASOTEC) 20  mg Oral Tablet    Take 1 Tablet (20 mg total) by mouth Once a day    fenofibrate micronized (LOFIBRA) 134 mg Oral Capsule    Take 1 Capsule (134 mg total) by mouth Once a day    fluticasone propionate (FLONASE) 50 mcg/actuation Nasal Spray, Suspension    Administer 2 Sprays into each nostril Twice per day as needed    multivit-minerals/folic acid (CENTRUM ADULTS ORAL)    Take 1 Tablet by mouth Once a day    pantoprazole (PROTONIX) 40 mg Oral Tablet, Delayed Release (E.C.)    Take 1 Tablet (40 mg total) by mouth Once a day    pregabalin (LYRICA) 150 mg Oral Capsule    Take 1 Capsule (150 mg total) by mouth Twice daily    vitamin B complex Oral Tablet    Take 1 Tablet by mouth Once a day          acetaminophen (TYLENOL) tablet, 650 mg, Oral, Q4H PRN  apixaban (ELIQUIS) tablet, 5 mg, Oral, Daily  aspirin chewable tablet 81 mg, 81 mg, Oral, Daily  cyclobenzaprine (FLEXERIL) tablet, 5 mg, Oral, 2x/day PRN  enalapril (VASOTEC) tablet, 20 mg, Oral, Daily  morphine 2 mg/mL injection, 2 mg, Intravenous, Q4H PRN  nicotine (NICODERM CQ) transdermal patch (mg/24 hr), 14 mg, Transdermal, Daily  ondansetron (ZOFRAN) 2 mg/mL injection, 4 mg, Intravenous, Q6H PRN  pantoprazole  (PROTONIX) delayed release tablet, 40 mg, Oral, Daily  pregabalin (LYRICA) capsule, 150 mg, Oral, 2x/day  regadenoson (LEXISCAN) 0.4 mg/5 mL injection ---Antonio Shields, , ,           Physical Exam:  General:  Alert and comfortable not in any kind of distress  Cardiac: Regular rate and rhythm, no murmur auscultated.  Respiratory: Clear to auscultation bilaterally without wheeze.  Abdomen: Positive bowel sounds, soft, nontender  Extremities: No peripheral edema noted on exam.    Vitals:  Temperature: 36.3 C (97.3 F)  Heart Rate: 69  Respiratory Rate: 18  BP (Non-Invasive): 117/81  SpO2: 96 %    Nursing note and vitals reviewed.     Labs:     Results for orders placed or performed during the hospital encounter of 02/09/22 (from the past 24 hour(s))   ECG 12 LEAD   Result Value Ref Range    Ventricular rate 83 BPM    Atrial Rate 83 BPM    PR Interval 130 ms    QRS Duration 94 ms    QT Interval 354 ms    QTC Calculation 415 ms    Calculated P Axis 22 degrees    Calculated R Axis 90 degrees    Calculated T Axis 34 degrees   THYROID STIMULATING HORMONE (SENSITIVE TSH)   Result Value Ref Range    TSH 1.204 0.450 - 5.330 uIU/mL   PTT (PARTIAL THROMBOPLASTIN TIME)   Result Value Ref Range    APTT 37.3 (H) 26.0 - 36.0 seconds   PT/INR   Result Value Ref Range    PROTHROMBIN TIME 12.7 9.8 - 12.7 seconds    INR 1.10 <=5.00   MAGNESIUM   Result Value Ref Range    MAGNESIUM 2.0 1.9 - 2.7 mg/dL   COMPREHENSIVE METABOLIC PANEL, NON-FASTING   Result Value Ref Range    SODIUM 138 136 - 145 mmol/L    POTASSIUM 4.0 3.5 - 5.1 mmol/L    CHLORIDE 101 98 - 107 mmol/L    CO2 TOTAL 29 21 - 31 mmol/L    ANION  GAP 8 (L) 10 - 20 mmol/L    BUN 11 7 - 25 mg/dL    CREATININE 0.95 0.60 - 1.30 mg/dL    BUN/CREA RATIO 12 6 - 22    ESTIMATED GFR 95 >59 mL/min/1.18m2    ALBUMIN 4.8 3.5 - 5.7 g/dL    CALCIUM 10.1 8.6 - 10.3 mg/dL    GLUCOSE 128 (H) 74 - 109 mg/dL    ALKALINE PHOSPHATASE 43 34 - 104 U/L    ALT (SGPT) 20 7 - 52 U/L    AST (SGOT) 19  13 - 39 U/L    BILIRUBIN TOTAL 0.5 0.3 - 1.2 mg/dL    PROTEIN TOTAL 7.7 6.4 - 8.9 g/dL    ALBUMIN/GLOBULIN RATIO 1.7 (H) 0.8 - 1.4    OSMOLALITY, CALCULATED 277 270 - 290 mOsm/kg    CALCIUM, CORRECTED 9.3 8.9 - 10.8 mg/dL    GLOBULIN 2.9 2.9 - 5.4   B-TYPE NATRIURETIC PEPTIDE   Result Value Ref Range    BNP <25 5 - 100 pg/mL   CBC WITH DIFF   Result Value Ref Range    WBCS UNCORRECTED 8.7 x10^3/uL    WBC 8.7 4.0 - 10.5 x10^3/uL    RBC 5.18 4.20 - 6.00 x10^6/uL    HGB 16.9 13.5 - 18.0 g/dL    HCT 48.7 42.0 - 51.0 %    MCV 93.9 78.0 - 99.0 fL    MCH 32.6 (H) 27.0 - 32.0 pg    MCHC 34.7 32.0 - 36.0 g/dL    RDW 13.2 11.6 - 14.8 %    PLATELETS 354 140 - 440 x10^3/uL    MPV 7.3 (L) 7.4 - 10.4 fL    NEUTROPHIL % 58 40 - 76 %    LYMPHOCYTE % 32 25 - 45 %    MONOCYTE % 9 0 - 12 %    EOSINOPHIL % 1 0 - 7 %    BASOPHIL % 1 0 - 3 %    NEUTROPHIL # 5.00 1.80 - 8.40 x10^3/uL    LYMPHOCYTE # 2.80 1.10 - 5.00 x10^3/uL    MONOCYTE # 0.70 0.00 - 1.30 x10^3/uL    EOSINOPHIL # 0.10 0.00 - 0.80 x10^3/uL    BASOPHIL # 0.10 0.00 - 0.30 x10^3/uL   TROPONIN NOW   Result Value Ref Range    TROPONIN I 4 <20 ng/L   GRAY TOP TUBE   Result Value Ref Range    RAINBOW/EXTRA TUBE AUTO RESULT Yes    TROPONIN IN ONE HOUR   Result Value Ref Range    TROPONIN I 4 <20 ng/L   LIPID PANEL   Result Value Ref Range    CHOLESTEROL 326 (H) <200 mg/dL    TRIGLYCERIDES 398 (H) <=150 mg/dL    HDL CHOL 52 23 - 92 mg/dL    LDL CALC 194 (H) 0 - 100 mg/dL    VLDL CALC 80 (H) 0 - 50 mg/dL    CHOL/HDL RATIO 6.3    TROPONIN-I   Result Value Ref Range    TROPONIN I 4 <20 ng/L   TROPONIN IN THREE HOURS   Result Value Ref Range    TROPONIN I 4 <20 ng/L   TROPONIN-I   Result Value Ref Range    TROPONIN I 4 <20 ng/L   BASIC METABOLIC PANEL, NON-FASTING   Result Value Ref Range    SODIUM 139 136 - 145 mmol/L    POTASSIUM 4.3 3.5 - 5.1 mmol/L    CHLORIDE  104 98 - 107 mmol/L    CO2 TOTAL 28 21 - 31 mmol/L    ANION GAP 7 (L) 10 - 20 mmol/L    CALCIUM 9.2 8.6 - 10.3 mg/dL     GLUCOSE 99 74 - 109 mg/dL    BUN 15 7 - 25 mg/dL    CREATININE 0.84 0.60 - 1.30 mg/dL    BUN/CREA RATIO 18 6 - 22    ESTIMATED GFR 103 >59 mL/min/1.37m2    OSMOLALITY, CALCULATED 278 270 - 290 mOsm/kg   MAGNESIUM   Result Value Ref Range    MAGNESIUM 2.0 1.9 - 2.7 mg/dL   CBC WITH DIFF   Result Value Ref Range    WBCS UNCORRECTED 7.3 x10^3/uL    WBC 7.3 4.0 - 10.5 x10^3/uL    RBC 4.70 4.20 - 6.00 x10^6/uL    HGB 15.2 13.5 - 18.0 g/dL    HCT 44.3 42.0 - 51.0 %    MCV 94.2 78.0 - 99.0 fL    MCH 32.3 (H) 27.0 - 32.0 pg    MCHC 34.3 32.0 - 36.0 g/dL    RDW 13.5 11.6 - 14.8 %    PLATELETS 294 140 - 440 x10^3/uL    MPV 7.6 7.4 - 10.4 fL    NEUTROPHIL % 46 40 - 76 %    LYMPHOCYTE % 42 25 - 45 %    MONOCYTE % 10 0 - 12 %    EOSINOPHIL % 2 0 - 7 %    BASOPHIL % 0 0 - 3 %    NEUTROPHIL # 3.40 1.80 - 8.40 x10^3/uL    LYMPHOCYTE # 3.10 1.10 - 5.00 x10^3/uL    MONOCYTE # 0.70 0.00 - 1.30 x10^3/uL    EOSINOPHIL # 0.20 0.00 - 0.80 x10^3/uL    BASOPHIL # 0.00 0.00 - 0.30 x10^3/uL       Diagnostic Tests   Reviewed:     Most Recent EKG This Encounter   ECG 12 LEAD    Collection Time: 02/09/22 12:45 PM   Result Value    Ventricular rate 83    Atrial Rate 83    PR Interval 130    QRS Duration 94    QT Interval 354    QTC Calculation 415    Calculated P Axis 22    Calculated R Axis 90    Calculated T Axis 34    Narrative    Normal sinus rhythm  Rightward axis  Borderline ECG     Confirmed by QHessie Dibble Lilla Callejo ([id number]) on 02/10/2022 12:27:26 AM     No results found for this or any previous visit.   '@LASTECHORESULTS'$ @   '@LASTECHO'$ @     Active Hospital Problems    Diagnosis    Primary Problem: Chest pain    Factor V Leiden (CMS HCC)    HTN (hypertension)    High cholesterol        Assessment:   1. Chest pain with multiple coronary risk factors including hypertension, hyperlipidemia and smoking.      2. Factor 5 leiden mutation for which he is on long-term Eliquis    3. Hypertension, hyperlipidemia and smoking     Plan:   Patient is having Lexiscan  myocardial stress test today and would review his perfusion scans     Was advised strongly to quit smoking     We will continue with his control of hypertension and statins and follow along with you.      Thank  you very much for asking me to take part in his management.    Hewitt Shorts, MD       This note was partially generated using MModal Fluency Direct system, and there may be some incorrect words, spellings, and punctuation that were not noted in checking the note before saving.

## 2022-02-10 NOTE — Care Plan (Signed)
Pt is being discharged after all testing came back negative. Pt aware of plan

## 2022-02-10 NOTE — Discharge Summary (Signed)
[place]     DISCHARGE SUMMARY      PATIENT NAME:  Antonio Shields   MRN:  [id number]  DOB:  06-06-1966    INPATIENT ADMISSION DATE: 02/09/2022   DATE OF DISCHARGE:  02/10/22     ATTENDING PHYSICIAN: Dionne Milo, MD     PRIMARY CARE PHYSICIAN: Haze Rushing, NP     HOSPITAL PRESENTATION:    Please see full admission H&P for details.      As per HPI:  Patient was admitted to [facility] yesterday when he presented to ER with 3 month history of chest pain on and off.  He was evaluated in ER and troponins were negative.  He gave history of smoking hypertension hyperlipidemia and family history of heart disease at age 36.  Decision was made to admit patient.  Cardiology was consulted and patient underwent cardiac stress test.  His Lexiscan stress test is normal with no perfusion abnormality or fixed defect.  It was read as low risk stress test.  Findings were discussed with patient he would like to go home.  Plan discharge home.  Further workup as deemed necessary could be done as outpatient by primary care provider.  Please refer to complete electronic health record for further details.    FURTHER HOSPITAL COURSE WITH DISCHARGE DIAGNOSES:          Problem List:  Active Hospital Problems   (*Primary Problem)    Diagnosis    *Chest pain    Factor V Leiden (CMS HCC)     Chronic    HTN (hypertension)     Chronic    High cholesterol     Chronic               PHYSICAL EXAM   DAY OF DISCHARGE:    BP (!) 138/92   Pulse 83   Temp 36.7 C (98 F)   Resp 18   Ht 1.803 m (5' 11")   Wt 88.5 kg (195 lb)   SpO2 98%   BMI 27.20 kg/m          General:  Patient is resting in bed, no acute distress, alert and oriented x3   Eyes:  PERRL, no scleral icterus   HENT:  Normocephalic, atraumatic, oral mucosa is moist and pink, no nasal discharge   Heart:  RRR, S1 and S2 auscultated, no murmurs appreciated   Lungs:  Unlabored respirations.  Lungs are clear to auscultation bilaterally, no  wheezes, no rales  Abdomen:  Soft, active bowel sounds, non-tender to palpation, non-distended  Extremities:  Pulses equal in all extremities bilaterally.  Capillary refill less than 3 seconds.  No edema in lower extremities bilaterally   Skin:  Warm and dry.  Not diaphoretic  Neuro:  A&O x 3.  No focal deficits.  Speech intact.  Not tremulous  Psych:  Cooperative, not agitated    LABS:  CBC with Diff (Last 48 Hours):    Recent Results in last 48 hours     02/09/22  1259 02/10/22  0501   WBC 8.7 7.3   HGB 16.9 15.2   HCT 48.7 44.3   MCV 93.9 94.2   PLTCNT 354 294   PMNS 58 46   LYMPHOCYTES 32 42   MONOCYTES 9 10   EOSINOPHIL 1 2   BASOPHILS 1  0.10 0  0.00          Last BMP  (Last result in the past 48 hours)  Na   K   Cl   CO2   BUN   Cr   Calcium   Glucose   Glucose-Fasting        02/10/22 0501 139   4.3   104   28   15   0.84   9.2   99               Last Hepatic Panel  (Last result in the past 48 hours)        Albumin   Total PTN   Total Bili   Direct Bili   Ast/SGOT   Alt/SGPT   Alk Phos        02/09/22 1259 4.8   7.7   0._0 43                BMP (Last 48 Hours):    Recent Results in last 48 hours     02/09/22  1259 02/10/22  0501   SODIUM 138 139   POTASSIUM 4.0 4.3   CHLORIDE 101 104   CO2 29 28   BUN 11 15   CREATININE 0.95 0.84   CALCIUM 10.1 9.2   GLUCOSENF 128* 99          IMAGING:  No images are attached to the encounter.     DISCHARGE MEDICATIONS:     Current Discharge Medication List        CONTINUE these medications - NO CHANGES were made during your visit.        Details   aspirin 81 mg Tablet, Delayed Release (E.C.)  Commonly known as: ECOTRIN   81 mg, Oral, DAILY  Refills: 0     CENTRUM ADULTS ORAL   1 Tablet, Oral, DAILY  Refills: 0     cyclobenzaprine 5 mg Tablet  Commonly known as: FLEXERIL   Twice per day as needed  Refills: 0     Eliquis 5 mg Tablet  Generic drug: apixaban   Take 1 Tablet (5 mg total) by mouth Once a day  Refills: 0     enalapril 20 mg Tablet  Commonly  known as: VASOTEC   Take 1 Tablet (20 mg total) by mouth Once a day  Refills: 0     fenofibrate micronized 134 mg Capsule  Commonly known as: LOFIBRA   Take 1 Capsule (134 mg total) by mouth Once a day  Refills: 0     fluticasone propionate 50 mcg/actuation Spray, Suspension  Commonly known as: FLONASE   Administer 2 Sprays into each nostril Twice per day as needed  Refills: 0     pantoprazole 40 mg Tablet, Delayed Release (E.C.)  Commonly known as: PROTONIX   Take 1 Tablet (40 mg total) by mouth Once a day  Refills: 0     pregabalin 150 mg Capsule  Commonly known as: LYRICA   Take 1 Capsule (150 mg total) by mouth Twice daily  Refills: 0     vitamin B complex Tablet   1 Tablet, Oral, DAILY  Refills: 0                 DISCHARGE DISPOSITION:  Home    DISCHARGE INSTRUCTIONS:    Follow-up Information       Haze Rushing, NP Follow up in 1 week(s).    Specialty: NURSE PRACTITIONER  Contact information:  [place]  [place] [address]  [phone]  No discharge procedures on file.   Follow-up Information       Haze Rushing, NP Follow up in 1 week(s).    Specialty: NURSE PRACTITIONER  Contact information:  [address]  [place] [address]  [phone]                                    Copies sent to Care Team         Relationship Specialty Notifications Start End    Haze Rushing, NP PCP - General NURSE PRACTITIONER  07/08/21     Phone: [phone] Fax: [phone]         [address] [address]            >30 minutes total were spent coordinating discharge day today      Dionne Milo, MD  [place] [facility]

## 2022-05-06 ENCOUNTER — Other Ambulatory Visit: Payer: Self-pay

## 2022-05-06 ENCOUNTER — Other Ambulatory Visit: Payer: MEDICAID | Attending: Orthopaedic Surgery

## 2022-05-06 DIAGNOSIS — Z01818 Encounter for other preprocedural examination: Secondary | ICD-10-CM | POA: Insufficient documentation

## 2022-05-06 LAB — BASIC METABOLIC PANEL
ANION GAP: 6 mmol/L (ref 4–13)
BUN/CREA RATIO: 13 (ref 6–22)
BUN: 12 mg/dL (ref 7–25)
CALCIUM: 9.7 mg/dL (ref 8.6–10.3)
CHLORIDE: 102 mmol/L (ref 98–107)
CO2 TOTAL: 30 mmol/L (ref 21–31)
CREATININE: 0.92 mg/dL (ref 0.60–1.30)
ESTIMATED GFR: 98 mL/min/{1.73_m2} (ref >59)
GLUCOSE: 122 mg/dL — ABNORMAL HIGH (ref 74–109)
OSMOLALITY, CALCULATED: 277 mOsm/kg (ref 270–290)
POTASSIUM: 4.3 mmol/L (ref 3.5–5.1)
SODIUM: 138 mmol/L (ref 136–145)

## 2022-05-06 LAB — URINALYSIS, MACROSCOPIC
BILIRUBIN: NEGATIVE mg/dL
BLOOD: NEGATIVE mg/dL
GLUCOSE: NEGATIVE mg/dL
KETONES: NEGATIVE mg/dL
LEUKOCYTES: NEGATIVE WBCs/uL
NITRITE: NEGATIVE
PH: 6.5 (ref 5.0–9.0)
PROTEIN: NEGATIVE mg/dL
SPECIFIC GRAVITY: 1.009 (ref 1.002–1.030)
UROBILINOGEN: NORMAL mg/dL

## 2022-05-06 LAB — URINALYSIS, MICROSCOPIC

## 2022-05-06 LAB — CBC WITH DIFF
BASOPHIL #: 0.1 10*3/uL (ref 0.00–0.10)
BASOPHIL %: 1 % (ref 0–1)
EOSINOPHIL #: 0.1 10*3/uL (ref 0.00–0.50)
EOSINOPHIL %: 2 %
HCT: 43.3 % (ref 36.7–47.1)
HGB: 15.3 g/dL (ref 12.5–16.3)
LYMPHOCYTE #: 2.6 10*3/uL (ref 1.00–3.00)
LYMPHOCYTE %: 40 % (ref 16–44)
MCH: 33.7 pg — ABNORMAL HIGH (ref 23.8–33.4)
MCHC: 35.4 g/dL (ref 32.5–36.3)
MCV: 95.2 fL (ref 73.0–96.2)
MONOCYTE #: 0.6 10*3/uL (ref 0.30–1.00)
MONOCYTE %: 9 % (ref 5–13)
MPV: 7.6 fL (ref 7.4–11.4)
NEUTROPHIL #: 3.1 10*3/uL (ref 1.85–7.80)
NEUTROPHIL %: 48 % (ref 43–77)
PLATELETS: 324 10*3/uL (ref 140–440)
RBC: 4.55 10*6/uL (ref 4.06–5.63)
RDW: 13.4 % (ref 12.1–16.2)
WBC: 6.4 10*3/uL (ref 3.6–10.2)

## 2022-05-25 ENCOUNTER — Inpatient Hospital Stay
Admission: RE | Admit: 2022-05-25 | Discharge: 2022-05-25 | Disposition: A | Discharge status: Home / Self Care | Payer: MEDICAID | Source: Ambulatory Visit | Attending: Orthopaedic Surgery | Admitting: Orthopaedic Surgery

## 2022-05-25 ENCOUNTER — Encounter (HOSPITAL_COMMUNITY)
Admission: RE | Disposition: A | Discharge status: Home / Self Care | Payer: Self-pay | Source: Ambulatory Visit | Attending: Orthopaedic Surgery

## 2022-05-25 ENCOUNTER — Encounter (HOSPITAL_COMMUNITY): Payer: MEDICAID | Admitting: Orthopaedic Surgery

## 2022-05-25 ENCOUNTER — Other Ambulatory Visit: Payer: Self-pay

## 2022-05-25 ENCOUNTER — Encounter (HOSPITAL_COMMUNITY): Payer: Self-pay | Admitting: Orthopaedic Surgery

## 2022-05-25 ENCOUNTER — Ambulatory Visit (HOSPITAL_COMMUNITY): Payer: MEDICAID | Admitting: Certified Registered"

## 2022-05-25 DIAGNOSIS — M25861 Other specified joint disorders, right knee: Secondary | ICD-10-CM | POA: Insufficient documentation

## 2022-05-25 SURGERY — DEBRIDEMENT LEG
Anesthesia: General | Site: Knee | Laterality: Right | Wound class: Clean Wound: Uninfected operative wounds in which no inflammation occurred

## 2022-05-25 MED ORDER — CEFAZOLIN 1 GRAM SOLUTION FOR INJECTION
2.0000 g | Freq: Once | INTRAMUSCULAR | Status: AC
Start: 2022-05-25 — End: 2022-05-25
  Administered 2022-05-25: 2 g via INTRAVENOUS

## 2022-05-25 MED ORDER — LACTATED RINGERS INTRAVENOUS SOLUTION
INTRAVENOUS | Status: DC
Start: 2022-05-25 — End: 2022-05-25

## 2022-05-25 MED ORDER — FENTANYL (PF) 50 MCG/ML INJECTION WRAPPER
50.0000 ug | INJECTION | INTRAMUSCULAR | Status: DC | PRN
Start: 2022-05-25 — End: 2022-05-25
  Administered 2022-05-25: 50 ug via INTRAVENOUS

## 2022-05-25 MED ORDER — ALBUTEROL SULFATE HFA 90 MCG/ACTUATION AEROSOL INHALER
INHALATION_SPRAY | Freq: Once | RESPIRATORY_TRACT | Status: DC | PRN
Start: 2022-05-25 — End: 2022-05-25
  Administered 2022-05-25: 3 via RESPIRATORY_TRACT

## 2022-05-25 MED ORDER — SODIUM CHLORIDE 0.9 % INTRAVENOUS PIGGYBACK
INJECTION | INTRAVENOUS | Status: AC
Start: 2022-05-25 — End: 2022-05-25
  Filled 2022-05-25: qty 100

## 2022-05-25 MED ORDER — FAMOTIDINE (PF) 20 MG/2 ML INTRAVENOUS SOLUTION
INTRAVENOUS | Status: AC
Start: 2022-05-25 — End: 2022-05-25
  Filled 2022-05-25: qty 2

## 2022-05-25 MED ORDER — OXYCODONE-ACETAMINOPHEN 5 MG-325 MG TABLET
1.0000 | ORAL_TABLET | Freq: Once | ORAL | Status: DC | PRN
Start: 2022-05-25 — End: 2022-05-25
  Administered 2022-05-25: 1 via ORAL
  Filled 2022-05-25: qty 1

## 2022-05-25 MED ORDER — DEXAMETHASONE SODIUM PHOSPHATE 4 MG/ML INJECTION SOLUTION
INTRAMUSCULAR | Status: AC
Start: 2022-05-25 — End: 2022-05-25
  Filled 2022-05-25: qty 1

## 2022-05-25 MED ORDER — SODIUM CHLORIDE 0.9 % (FLUSH) INJECTION SYRINGE
3.0000 mL | INJECTION | Freq: Three times a day (TID) | INTRAMUSCULAR | Status: DC
Start: 2022-05-25 — End: 2022-05-25

## 2022-05-25 MED ORDER — LACTATED RINGERS INTRAVENOUS SOLUTION
INTRAVENOUS | Status: DC
Start: 2022-05-25 — End: 2022-05-25
  Administered 2022-05-25: 700 mL via INTRAVENOUS

## 2022-05-25 MED ORDER — SODIUM CHLORIDE 0.9 % (FLUSH) INJECTION SYRINGE
3.0000 mL | INJECTION | INTRAMUSCULAR | Status: DC | PRN
Start: 2022-05-25 — End: 2022-05-25

## 2022-05-25 MED ORDER — ONDANSETRON HCL (PF) 4 MG/2 ML INJECTION SOLUTION
4.0000 mg | Freq: Once | INTRAMUSCULAR | Status: DC | PRN
Start: 2022-05-25 — End: 2022-05-25

## 2022-05-25 MED ORDER — FAMOTIDINE (PF) 20 MG/2 ML INTRAVENOUS SOLUTION
20.0000 mg | Freq: Once | INTRAVENOUS | Status: AC
Start: 2022-05-25 — End: 2022-05-25
  Administered 2022-05-25: 20 mg via INTRAVENOUS

## 2022-05-25 MED ORDER — LIDOCAINE (PF) 100 MG/5 ML (2 %) INTRAVENOUS SYRINGE
INJECTION | Freq: Once | INTRAVENOUS | Status: DC | PRN
Start: 2022-05-25 — End: 2022-05-25
  Administered 2022-05-25: 80 mg via INTRAVENOUS

## 2022-05-25 MED ORDER — HYDROCODONE 5 MG-ACETAMINOPHEN 325 MG TABLET
1.0000 | ORAL_TABLET | ORAL | 0 refills | Status: AC | PRN
Start: 2022-05-25 — End: ?

## 2022-05-25 MED ORDER — FENTANYL (PF) 50 MCG/ML INJECTION WRAPPER
25.0000 ug | INJECTION | INTRAMUSCULAR | Status: DC | PRN
Start: 2022-05-25 — End: 2022-05-25
  Administered 2022-05-25: 25 ug via INTRAVENOUS

## 2022-05-25 MED ORDER — CEFAZOLIN 1 GRAM SOLUTION FOR INJECTION
INTRAMUSCULAR | Status: AC
Start: 2022-05-25 — End: 2022-05-25
  Filled 2022-05-25: qty 20

## 2022-05-25 MED ORDER — DEXAMETHASONE SODIUM PHOSPHATE 4 MG/ML INJECTION SOLUTION
4.0000 mg | Freq: Once | INTRAMUSCULAR | Status: AC
Start: 2022-05-25 — End: 2022-05-25
  Administered 2022-05-25: 4 mg via INTRAVENOUS

## 2022-05-25 MED ORDER — PROPOFOL 10 MG/ML IV BOLUS
INJECTION | Freq: Once | INTRAVENOUS | Status: DC | PRN
Start: 2022-05-25 — End: 2022-05-25
  Administered 2022-05-25: 300 mg via INTRAVENOUS

## 2022-05-25 MED ORDER — IPRATROPIUM 0.5 MG-ALBUTEROL 3 MG (2.5 MG BASE)/3 ML NEBULIZATION SOLN
3.0000 mL | INHALATION_SOLUTION | Freq: Once | RESPIRATORY_TRACT | Status: DC | PRN
Start: 2022-05-25 — End: 2022-05-25

## 2022-05-25 MED ORDER — ALBUTEROL SULFATE 2.5 MG/3 ML (0.083 %) SOLUTION FOR NEBULIZATION
2.5000 mg | INHALATION_SOLUTION | Freq: Once | RESPIRATORY_TRACT | Status: DC | PRN
Start: 2022-05-25 — End: 2022-05-25

## 2022-05-25 MED ORDER — FENTANYL (PF) 50 MCG/ML INJECTION SOLUTION
INTRAMUSCULAR | Status: AC
Start: 2022-05-25 — End: 2022-05-25
  Filled 2022-05-25: qty 2

## 2022-05-25 MED ORDER — PROCHLORPERAZINE EDISYLATE 10 MG/2 ML (5 MG/ML) INJECTION SOLUTION
5.0000 mg | Freq: Once | INTRAMUSCULAR | Status: DC | PRN
Start: 2022-05-25 — End: 2022-05-25

## 2022-05-25 MED ORDER — HYDROMORPHONE 2 MG/ML INJECTION WRAPPER
0.5000 mg | INJECTION | Freq: Once | INTRAMUSCULAR | Status: DC | PRN
Start: 2022-05-25 — End: 2022-05-25

## 2022-05-25 MED ORDER — FENTANYL (PF) 50 MCG/ML INJECTION WRAPPER
INJECTION | Freq: Once | INTRAMUSCULAR | Status: DC | PRN
Start: 2022-05-25 — End: 2022-05-25
  Administered 2022-05-25: 50 ug via INTRAVENOUS
  Administered 2022-05-25 (×2): 25 ug via INTRAVENOUS

## 2022-05-25 MED ORDER — ONDANSETRON HCL (PF) 4 MG/2 ML INJECTION SOLUTION
INTRAMUSCULAR | Status: AC
Start: 2022-05-25 — End: 2022-05-25
  Filled 2022-05-25: qty 2

## 2022-05-25 MED ORDER — ONDANSETRON HCL (PF) 4 MG/2 ML INJECTION SOLUTION
4.0000 mg | Freq: Once | INTRAMUSCULAR | Status: AC
Start: 2022-05-25 — End: 2022-05-25
  Administered 2022-05-25: 4 mg via INTRAVENOUS

## 2022-05-25 SURGICAL SUPPLY — 95 items
BAG SUT DVN STRL LF (SUTURE/WOUND CLOSURE) ×1 IMPLANT
BAG SUTURE DEVON STERILE LATEX FREE (SUTURE/WOUND CLOSURE) ×1
BANDAGE 3.6YDX3.4IN 6 PLY HYPOALL LOFT LIGHT STRCH COTTON GAUZE STRL LF  DISP (WOUND CARE SUPPLY) ×1 IMPLANT
BLADE 10 2 END CBNSTL SURG STRL DISP (SURGICAL CUTTING SUPPLIES) ×2 IMPLANT
BLADE 15 2 END CBNSTL SURG STRL DISP (SURGICAL CUTTING SUPPLIES) ×1 IMPLANT
CLEANER ESURG TIP 2X2IN TIP POLISHR CAUT STRL LF (SURGICAL CUTTING SUPPLIES) IMPLANT
CLEANER INSTR PREPZYME MUL-TRD CONTAINR NARSL NEUT PH BDGR (MISCELLANEOUS PT CARE ITEMS) ×1
CONV USE ITEM 321837 - GLOVE SURG 7.5 LTX PF NONST CRM (GLOVES AND ACCESSORIES) IMPLANT
CONV USE ITEM 321863 - GLOVE SURG 6.5 LF PF SMOOTH STRL GRN  PLISPRN MICRO (GLOVES AND ACCESSORIES) ×1 IMPLANT
CONV USE ITEM 321982 - GLOVE SURG 7 LTX CHEMO PF SMOOTH BEAD CUF STRL WHT 11.6IN PLMR THK.2MM THK.21MM (GLOVES AND ACCESSORIES) IMPLANT
CONV USE ITEM 321983 - GLOVE SURG 8 LTX CHEMO PF SMOOTH BEAD CUF STRL WHT 11.6IN PLMR THK.2MM THK.21MM (GLOVES AND ACCESSORIES) ×1 IMPLANT
CONV USE ITEM 329146 - CLEANER INSTR PREPZYME MUL-TRD CONTAINR NARSL NEUT PH BDGR 22OZ (MISCELLANEOUS PT CARE ITEMS) ×1 IMPLANT
CONV USE ITEM 338662 - PACK SURG ASCP STRL DISP ~~LOC~~ BPT MED CNTR LF (CUSTOM TRAYS & PACK) ×1 IMPLANT
CONV USE ITEM 34153 - ELECTRODE ESURG BLADE PNCL 3/32IN STRL SS CAUT PSHBTN STD SHAFT LF  VEGA SER (SURGICAL CUTTING SUPPLIES) ×1 IMPLANT
COUNTER 20 CNT BLOCK ADH NEEDLE STRL LF  RD SHARP FOAM 15.75X11.5X14IN DISP (MED SURG SUPPLIES) ×1 IMPLANT
COUNTER 20 CNT BLOCK ADH NEEDLE STRL LF RD SHARP FOAM 15.75 (MED SURG SUPPLIES) ×1
COVER 53X24IN MAYOSTAND PRXM STRL DISP EQP SMS LF (DRAPE/PACKS/SHEETS/OR TOWEL) IMPLANT
COVER TBL 90X50IN STD SMS REINF FNFLD STRL LF  DISP (DRAPE/PACKS/SHEETS/OR TOWEL) ×2 IMPLANT
COVER TBL 90X50IN STD SMS REINF FNFLD STRL LF DISP (DRAPE/PACKS/SHEETS/OR TOWEL) ×2
DRAPE 2 INCS FILM ANTIMIC 23X17IN IOBN STRL SURG (DRAPE/PACKS/SHEETS/OR TOWEL) ×1 IMPLANT
DRAPE 2 INCS FILM ANTIMIC 23X1_7IN IOBN STRL SURG (DRAPE/PACKS/SHEETS/OR TOWEL) ×1
DRAPE FNFLD ABS REINF 77X53IN 43528 PRXM LF  STRL DISP SURG SMS 44X23IN (DRAPE/PACKS/SHEETS/OR TOWEL) ×2 IMPLANT
DRAPE FNFLD ABS REINF 77X53IN_43528 PRXM LF STRL DISP SURG (DRAPE/PACKS/SHEETS/OR TOWEL) ×2
DRAPE MAYOSTAND CVR 53X24IN PR_XM LF STRL DISP EQP SMS (DRAPE/PACKS/SHEETS/OR TOWEL)
DRESS PETRO 9X5IN CURAD XR COTTON NONADH OCL IMPREGNATE LF  STRL WHT (WOUND CARE SUPPLY) ×1 IMPLANT
DRESS PETRO 9X5IN CURAD XR COT_TON NONADH OCL IMPREGNATE LF (WOUND CARE/ENTEROSTOMAL SUPPLY) ×1
ELECTRODE ESURG BLADE PNCL 3/32IN STRL SS CAUT PSHBTN STD (CUTTING ELEMENTS) ×1
ELECTRODE PATIENT RTN 9FT VLAB C30- LB RM PHSV ACRL FOAM CORD NONIRRITATE NONSENSITIZE ADH STRP (SURGICAL CUTTING SUPPLIES) ×1 IMPLANT
ELECTRODE PATIENT RTN 9FT VLAB_REM C30- LB PLHSV ACRL FOAM (CUTTING ELEMENTS) ×1
GAUZE BND ROLL 3.4IN X 3.6 YDS_MEDC (WOUND CARE/ENTEROSTOMAL SUPPLY) ×1
GLOVE SURG 6 LF  PF BEAD CUF STRL CRM 11.3IN PROTEXIS PI (GLOVES AND ACCESSORIES)
GLOVE SURG 6 LF  PF BEAD CUF STRL CRM 11.3IN PROTEXIS PLISPRN THK9.1 MIL (GLOVES AND ACCESSORIES) IMPLANT
GLOVE SURG 6 LF  PF SMOOTH BEAD CUF STRL GRN 12IN SENSICARE PI PLISPRN PLMR ALOE THK7.9 MIL DISP (GLOVES AND ACCESSORIES) IMPLANT
GLOVE SURG 6 LF PF SMOOTH BEAD CUF STRL GRN 12IN SENSICARE (GLOVES AND ACCESSORIES)
GLOVE SURG 6.5 LF  PF BEAD CUF STRL CRM 11.3IN PROTEXIS PI (GLOVES AND ACCESSORIES) ×2
GLOVE SURG 6.5 LF  PF BEAD CUF STRL CRM 11.3IN PROTEXIS PI PLISPRN THK9.1 MIL (GLOVES AND ACCESSORIES) ×2 IMPLANT
GLOVE SURG 6.5 LTX PF BEAD CUF MICRO ROUGHEN N-PYRG STRL STRW BGL SRG CURVE FINGER (GLOVES AND ACCESSORIES) IMPLANT
GLOVE SURG 6.5 LTX PF BEAD CUF_MICRO RGH N-PYRG STRL STRW (GLOVES AND ACCESSORIES)
GLOVE SURG 7 LF  PF BEAD CUF STRL CRM 11.8IN PROTEXIS PI (GLOVES AND ACCESSORIES)
GLOVE SURG 7 LF  PF BEAD CUF STRL CRM 11.8IN PROTEXIS PI PLISPRN THK9.1 MIL (GLOVES AND ACCESSORIES) IMPLANT
GLOVE SURG 7 LF  PF SMOOTH TXTR BEAD CUF STRL GRN 12IN SENSICARE PI PLISPRN SYN PLMR ALOE THK7.9 MIL (GLOVES AND ACCESSORIES) ×1 IMPLANT
GLOVE SURG 7 LF PF SMOOTH TXTR BEAD CUF STRL GRN 12IN (GLOVES AND ACCESSORIES) ×1
GLOVE SURG 7 LTX PF SMOOTH STRL CRM (GLOVES AND ACCESSORIES)
GLOVE SURG 7.5 LF  PF BEAD CUF STRL CRM 11.8IN PROTEXIS PI (GLOVES AND ACCESSORIES)
GLOVE SURG 7.5 LF  PF BEAD CUF STRL CRM 11.8IN PROTEXIS PI PLISPRN THK9.1 MIL (GLOVES AND ACCESSORIES) IMPLANT
GLOVE SURG 7.5 LF  PF SMOOTH BEAD CUF STRL GRN 12IN SENSICARE PI GRN PLISPRN PLMR ALOE THK7.9 MIL (GLOVES AND ACCESSORIES) IMPLANT
GLOVE SURG 7.5 LF PF SMOOTH BEAD CUF STRL GRN 12IN (GLOVES AND ACCESSORIES)
GLOVE SURG 7.5 LTX PF SMOOTH STRL CRM (GLOVES AND ACCESSORIES)
GLOVE SURG 8 LF  PF BEAD CUF SMOOTH TXTR STRL GRN 12IN SENSICARE PLISPRN SYN PLMR ALOE THK7.9 MIL (GLOVES AND ACCESSORIES) IMPLANT
GLOVE SURG 8 LF  PF BEAD CUF STRL CRM 11.8IN PROTEXIS PI PLISPRN THK9.1 MIL (GLOVES AND ACCESSORIES) IMPLANT
GLOVE SURG 8 LF PF BEAD CUF SMOOTH TXTR STRL GRN 12IN (GLOVES AND ACCESSORIES)
GLOVE SURG 8 LF PF BEAD CUF STRL CRM 11.8IN PROTEXIS PI (GLOVES AND ACCESSORIES)
GLOVE SURG 8 LTX PF SMOOTH STRL CRM (GLOVES AND ACCESSORIES) ×1
GLOVE SURG 8.5 LF  PF BEAD CUF STRL CRM 11.8IN PROTEXIS PI PLISPRN THK9.1 MIL (GLOVES AND ACCESSORIES) IMPLANT
GLOVE SURG 8.5 LF PF BEAD CUF STRL CRM 11.8IN PROTEXIS PI (GLOVES AND ACCESSORIES)
GOWN SURG LRG STD LGTH REG L3 NONREINFORCE BRTHBL TWL STRL (DRAPE/PACKS/SHEETS/OR TOWEL) ×2
GOWN SURG LRG STD LGTH REG L3 NONREINFORCE BRTHBL TWL STRL LF  DISP BLU HALYARD SPECTRUM SMS (DRAPE/PACKS/SHEETS/OR TOWEL) ×2 IMPLANT
GOWN SURG XL STD LGTH L3 HKLP CLSR RGLN SLEEVE TWL STRL LF (DRAPE/PACKS/SHEETS/OR TOWEL) ×1
GOWN SURG XL STD LGTH L3 HKLP CLSR RGLN SLEEVE TWL STRL LF  DISP GRN AERO BLU PRFRM FBRC (DRAPE/PACKS/SHEETS/OR TOWEL) ×1 IMPLANT
GOWN SURG XL STD LGTH L3 NONREINFORCE HKLP CLSR TWL STRL LF (DRAPE/PACKS/SHEETS/OR TOWEL) ×1
GOWN SURG XL STD LGTH L3 NONREINFORCE HKLP CLSR TWL STRL LF  DISP BLU SPECTRUM SMS (DRAPE/PACKS/SHEETS/OR TOWEL) ×1
GOWN SURG XL STD LGTH L3 NONREINFORCE HKLP CLSR TWL STRL LF DISP BLU SPECTRUM SMS (DRAPE/PACKS/SHEETS/OR TOWEL) ×1 IMPLANT
GOWN SURG XL XLNG L4 REINF HKLP CLSR SET IN SLEEVE STRL LF (DRAPE/PACKS/SHEETS/OR TOWEL) ×1
GOWN SURG XL XLNG L4 REINF HKLP CLSR SET IN SLEEVE STRL LF  DISP BLU SIRUS SMS PE 56IN (DRAPE/PACKS/SHEETS/OR TOWEL) ×1 IMPLANT
HANDPC PLS LAV INTPLS SUCT FAN SPRAY TIP (MED SURG SUPPLIES)
LABEL MED CORRECT MED LABELING SYS 4 FLG 2 SHEET 24 PRPRNT (MED SURG SUPPLIES) ×1
LABEL MED CORRECT MED LABELING SYS 4 FLG 2 SHEET 24 PRPRNT STRL (MED SURG SUPPLIES) ×1 IMPLANT
PACK ARTHROGRAM MDLN INDUSTRIES INC. RAD GEN N/M S DISP (CUSTOM TRAYS & PACK) ×1
PACK SURG ASCP STRL DISP ~~LOC~~ BPT MED CNTR LF (CUSTOM TRAYS & PACK) ×1
PAD ABDOMINAL 7.5X8 STRL (WOUND CARE/ENTEROSTOMAL SUPPLY) ×1
PAD ABDOMINAL 8X7.5IN LF  STRL (WOUND CARE SUPPLY) ×1 IMPLANT
SET INTPLS SUCT TUBE FAN SPRAY TIP HANDPC STRL LF  DISP (MED SURG SUPPLIES) IMPLANT
SOL IRRG 0.9% NACL 2000ML PRSV FR N-PYRG FLXB CONTAINR STRL (MEDICATIONS/SOLUTIONS)
SOL IRRG 0.9% NACL 2000ML PRSV FR N-PYRG FLXB CONTAINR STRL LF (MEDICATIONS/SOLUTIONS) IMPLANT
SPONGE GAUZE 4X4IN MDCHC COTTON 12 PLY TY 7 LF  STRL DISP (WOUND CARE SUPPLY) ×1 IMPLANT
SPONGE GAUZE 4X4IN MDCHC COTTO_N 12 PLY TY 7 LF STRL DISP (WOUND CARE/ENTEROSTOMAL SUPPLY) ×1
SPONGE LAP 18X18IN PREWASH RIGID TRY STRL LF  WHT (MED SURG SUPPLIES) ×1 IMPLANT
SPONGE LAP 18X18IN PREWASH RIGID TRY STRL LF WHT (MED SURG SUPPLIES) ×1
STKNT ORTHO 60X6IN COTTON PLSTR 2 PLY PCUT SEWN END LF  TUB STRL OFF WHT (ORTHOPEDICS (NOT IMPLANTS)) IMPLANT
STKNT ORTHO 60X6IN COTTON PLSTR 2 PLY PCUT SEWN END LF TUB (ORTHOPEDICS (NOT IMPLANTS))
SUTURE 1 GS-24 POLYSRB 30IN UNDYED BRD COAT ABS (SUTURE/WOUND CLOSURE) ×1 IMPLANT
SUTURE 2-0 GS-11 POLYSRB 30IN UNDYED BRD COAT ABS (SUTURE/WOUND CLOSURE) ×1 IMPLANT
SUTURE 3-0 V-20 POLYSORB 30IN VIOL BRD COAT ABS (SUTURE/WOUND CLOSURE) ×2 IMPLANT
SUTURE 3-0 V-20 POLYSRB 30IN VIOL BRD COAT ABS (SUTURE/WOUND CLOSURE) ×2
SUTURE 4-0 C-14 SURGIPRO2 18IN BLU MONOF NONAB (SUTURE/WOUND CLOSURE) ×1 IMPLANT
SUTURE 4-0 PS2 MONOCRYL MTPS 27IN UNDYED MONOF ABS (SUTURE/WOUND CLOSURE) ×1 IMPLANT
SUTURE 4-0 PS2 MONOCRYL MTPS 2_7IN UNDYED MONOF ABS (SUTURE/WOUND CLOSURE) ×1
SWAB BD BBL BD CLTSWB LIQUID STRT MED RYN TMPR EVD SEAL 2 ACT CAP RND BTM TUBE 5.25IN STRL CULT LF (MISCELLANEOUS PT CARE ITEMS) IMPLANT
SWAB BD CLTSWB LQ STRT MED CULT (MISCELLANEOUS PT CARE ITEMS)
TAPE DURAPORE 2IN 6/BX 1538-2 (WOUND CARE SUPPLY) ×1 IMPLANT
TOWEL 24X16IN COTTON BLU DISP SURG STRL LF (DRAPE/PACKS/SHEETS/OR TOWEL) ×2 IMPLANT
TUBING SUCT CLR 6FT .25IN ARGYLE PVC NCDTV STR MALE FEMALE (MED SURG SUPPLIES)
TUBING SUCT CLR 6FT .25IN ARGYLE PVC NCDTV STR MALE FEMALE MLD CONN STRL LF (MED SURG SUPPLIES) IMPLANT
WOUND IRRG IRRISEPT DBRD CLNSG 0.05% CHG SYSTEM STRL LF (WOUND CARE SUPPLY) ×1 IMPLANT
WOUND IRRG IRRISEPT DBRD CLNSG_0.05% CHG SYSTEM STRL LF (WOUND CARE/ENTEROSTOMAL SUPPLY) ×1

## 2022-05-25 NOTE — Discharge Instructions (Addendum)
Follow up in office on December 12th, at 9:00 AM with Dr. Marcelino Scot.     Call the office with any questions or concerns that you may have.     Weight bearing as tolerated. Use crutches as needed for assistance.    Leave your dressing in place for 24 hours. After removing the dressing, you may shower.     Cover your incision as needed. Wash your incision site with soap and water only.     Do Not apply any creams, lotions, ointments, or powders to your incision site unless approved by your doctor.     Resume your home medications as previously prescribed.     Stop by your pharmacy and have your prescription filled.

## 2022-05-25 NOTE — Anesthesia Postprocedure Evaluation (Signed)
Anesthesia Post Op Evaluation    Patient: Antonio Shields  Procedure(s):  EXCISION OF CYST ON RIGHT KNEE    Last Vitals:Temperature: 36.2 C (97.2 F) (05/25/22 0917)  Heart Rate: 72 (05/25/22 0929)  BP (Non-Invasive): (!) 142/94 (05/25/22 0929)  Respiratory Rate: 16 (05/25/22 0929)  SpO2: 96 % (05/25/22 0929)    No notable events documented.    Patient is sufficiently recovered from the effects of anesthesia to participate in the evaluation and has returned to their pre-procedure level.  Patient location during evaluation: PACU       Patient participation: complete - patient participated  Level of consciousness: awake and alert and responsive to verbal stimuli    Pain management: adequate  Airway patency: patent    Anesthetic complications: no  Cardiovascular status: acceptable  Respiratory status: acceptable  Hydration status: acceptable  Patient post-procedure temperature: Pt Normothermic   PONV Status: Absent

## 2022-05-25 NOTE — OR Surgeon (Signed)
[place]  Operative Note     PATIENT NAME:  Antonio Shields, Antonio Shields  MRN:  [id number]  DOB:  08/14/1965    Date of Procedure:  05/25/2022  Preoperative Diagnosis: CYST RIGHT KNEE   Postoperative Diagnoses:  CYST RIGHT KNEE   Procedure Performed: Procedure(s) (LRB):  EXCISION OF CYST ON RIGHT KNEE (Right)   Implants:* No implants in log *   Surgeon: Karsten Ro, MD   Anesthesia: Anesthesiologist: Orvilla Cornwall, DO  CRNA: Darcel Smalling, CRNA   OR Staff: Circulator: Lollie Marrow, RN  PERIOPERATIVE CARE ASSISTANT: Avel Peace, PCA  Scrub Person: Krystal Clark, RN  Scrub First Assist: Darnelle Maffucci, CST   Anticoagulation: other    Antibiotics:  NA  Estimated Blood Loss: * No blood loss amount entered *   Specimens: * No specimens in log *   Complications: None immediate  Indications For Procedure:  Antonio Shields  is a very pleasant  56 y.o. male  presenting  for CYST RIGHT KNEE Risks, benefits, indications, and complications of procedure were discussed, and informed signed consent obtained.  Intraoperative Findings: Large, complex cyst with retained meniscal fragments.  Description of Procedure site and side verified anesthesia induced time-out completed with entire operative team.  Patient positioned with a bolster under the operative hip and leg.  Knee flexed 90 to keep the peroneal nerve away from operative site.  Incision was made over the center of the cyst dissection continued around the edges.  There was significant scar tissue under the previous lateral incision scar.  Dissection was carried out carefully posteriorly staying along the tensor fascia lata and superficial to stay out of the territory of the peroneal nerve.  After the periphery of the cyst was dissected out, incision was made in the central portion of the cyst and the cartilaginous fragments contained within removed.  The anterior the cyst was then explored the find the mouth of the cyst  communicating with the joint which is at the level of the lateral meniscus.  The edges of mouth the cyst were excised and then closed with 1. Interrupted sutures.  The remainder of the sac was then excised and the tensor fascia lata rent closed in a separate layer over the previous cyst mouth closure.  Subcutaneous tissue was then closed to obliterate dead space with multiple sutures.  Skin was closed with a running subcuticular stitch.  Sterile dressings applied.  Patient taken recovery under the supervision of anesthesia.  Karsten Ro, MD    This note was partially generated using MModal Fluency Direct system, and there may be some incorrect words, spellings, and punctuation that were not noted in checking the note before saving.

## 2022-05-25 NOTE — Anesthesia Transfer of Care (Signed)
ANESTHESIA TRANSFER OF CARE   Antonio Shields is a 56 y.o. ,male, Weight: 87.5 kg (193 lb)   had Procedure(s):  EXCISION OF CYST ON RIGHT KNEE  performed  05/25/22   Primary Service: Karsten Ro, MD    Past Medical History:   Diagnosis Date    Arthropathy     Cancer (CMS [facility]) 2000    TONGUE    Congenital factor VIII disorder (CMS [place])     High cholesterol     HTN (hypertension)     Spinal stenosis       Allergy History as of 05/25/22       CIPROFLOXACIN         Noted Status Severity Type Reaction    02/09/22 Lamoni, [place], RN 02/09/22 Active Medium  Rash              METRONIDAZOLE         Noted Status Severity Type Reaction    02/09/22 1255 Carleene Mains, RN 02/09/22 Active Low  Hives/ Urticaria                  I completed my transfer of care / handoff to the receiving personnel during which we discussed:  Access, Airway, All key/critical aspects of case discussed, Analgesia, Antibiotics, Expectation of post procedure, Fluids/Product, Gave opportunity for questions and acknowledgement of understanding, Labs and PMHx      Post Location: PACU                                                             Last OR Temp: Temperature: 36.2 C (97.2 F)  ABG:  POTASSIUM   Date Value Ref Range Status   05/06/2022 4.3 3.5 - 5.1 mmol/L Final     KETONES   Date Value Ref Range Status   05/06/2022 Negative Negative, Trace mg/dL Final     CALCIUM   Date Value Ref Range Status   05/06/2022 9.7 8.6 - 10.3 mg/dL Final     Calculated P Axis   Date Value Ref Range Status   02/10/2022 50 degrees Final     Calculated R Axis   Date Value Ref Range Status   02/10/2022 30 degrees Final     Calculated T Axis   Date Value Ref Range Status   02/10/2022 36 degrees Final     Airway:* No LDAs found *  Blood pressure 130/86, pulse 79, temperature 36.2 C (97.2 F), resp. rate 19, height 1.803 m ('5\' 11"'$ ), weight 87.5 kg (193 lb), SpO2 100%.

## 2022-05-25 NOTE — H&P (Signed)
Antonio Shields  [id number]        CHIEF COMPLAINT  Right Knee pain; 04/28/22; PJB     HISTORY  Recurrent dural cyst right knee: Patient has several year history of swelling over the lateral as pect of the right he had an aspiration of the cyst few months ago with prompt recurrence. MRI shows a large complex cyst loose bodies. Patient also has some arthritis in the knee and degenerative meniscal tears. Minimally symptomatic with regard to the meniscus. Remains moderately symptomatic with regard arthritis.     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, unspecified Cardiac: Hypertension and Hyperlipidemia Musculoskeletal: Osteoarthritis   Denies Ankylosing Spondylitis, Fibromyalgia, Gout, Lupus, Lyme's, Mixed connective tissue disease, Osteoporosis, Polymyalgia, Psoriatic arthritis, Raynaud, Scleroderma, Sjogren's/ Sicca and Vasculitis Rheumatologic: Denies N/A     PAST SURGICAL HISTORY  Date Side/ Site Procedure Details Doctor Facility Complications 92/33/00 Right Lower extremity-Knee 05/23/98 Misc Other Surgery removal of Knee arthroscopy Exc loose body Marcelino Scot MD, [facility]  squamous cell from tongue with lymph node removal in neck     ALLERGIES  Name Reactions Notes Cipro Break out Flagyl Break out     MEDICATIONS  Name Vasotec 20 mg 1 oral qd ------ACE Inhibitors Lyrica 150 mg 1 oral qd ------Anticonvulsant - GABA Analogs Fenoglide 120 mg 1 oral qd ------Antihyperlipidemic - Fibric Acid Derivatives Adult Aspirin 81 mg 1 oral qd ------Platelet Aggregation Inhibitors - Salicylates TRAMADOL-ACETAMINOPHN 37.5-325 37.5-32 37.5-325 37.5-32 40 ------Hyperuricemia Therapy - Xanthine Oxidase Inhibitors HYDROCODON-ACETAMINOPH 7.5-325 7.5-325 7.5-325 7.5-325 50 ------Hyperuricemia Therapy - Xanthine Oxidase Inhibitors NIASPAN ER 500 MG TABLET 500 MG 500 MG TABLET 500 MG 30 ------Hyperuricemia Therapy - Xanthine Oxidase Inhibitors PANTOPRAZOLE SOD DR 40 MG  TAB 40 MG 40 MG TAB 40 MG 30 ------Hyperuricemia Therapy - Xanthine Oxidase Inhibitors AZITHROMYCIN 250 MG TABLET 250 MG 250 MG TABLET 250 MG 6 ------Hyperuricemia Therapy - Xanthine Oxidase Inhibitors PROMETHAZINE-DM SYRUP 6.25-15 6.25-15 120 ------Hyperuricemia Therapy - Xanthine Oxidase Inhibitors TRAMADOL HCL 50 MG TABLET 50 MG 50 MG TABLET 50 MG 120 ------Hyperuricemia Therapy - Xanthine Oxidase Inhibitors IBUPROFEN 800 MG TABLET 800 MG 800 MG TABLET 800 MG 90 ------Hyperuricemia Therapy - Xanthine Oxidase Inhibitors TEMAZEPAM 30 MG CAPSULE 30 MG 30 MG CAPSULE 30 MG 30 ------Hyperuricemia Therapy - Xanthine Oxidase Inhibitors CYCLOBENZAPRINE 10 MG TABLET 10 MG 10 MG TABLET 10 MG 30 ------Hyperuricemia Therapy - Xanthine Oxidase Inhibitors Strength QTY Sig  Date 2021-09-12  2021-09-12  2021-09-12  2021-09-12  2014-06-25  2014-06-15  2014-05-21  2014-05-21  2014-04-13  2014-04-13  2014-04-02  2014-03-15  2014-03-09  2013-10-09     Family and Social History  Patient is Married. Smoking: Current every day smoker. 1* pckyr Recode: 1 ETOH: None Lives with family Mother age 51; Father: deceased age 62,causeofdeath. 2 brothers; 1 sisters.; Diabetes in father. Hypertension in mother and father MI in father Cholesterol mother and father Stroke in father History of Blood clots in No known family history of Bleeding, Neurologic conditions     REVIEW OF SYSTEMS  General : Positive for decreased energy and disrupted sleep . Denies Wt loss, Wt gain, loss of appetite, fever, chills, sweats and fatigue Opthalmic: Negative for change in vision / eye problems. Ear, nose throat positive for other ENT complaint been coughing; negative for hearing loss, tinnitus, hoarseness, dysphagia, nasal congenstion, sinus pain, sore throat and epistaxis Respiratory complaints of dyspnea, cough, productive cough and wheezingDenieshemoptysis and other respiratory problems Cardiac  positive for dyspnea on exertion , negative for chest pain /  pressure, orthopnea, palpitation, peripheral edema and other cardiac complaint. GI: No changes in bowel habits, abdominal pain or other GI complaint. Male GU: Denies significant male genitourinary symptoms. Patient notes back pain, neck pain, knee pain, joint swelling and AM stiffness right knee pain, loose bodies on musculoskeletal system review; negative for arthritis, hip pain, dry eyes, dry mouth, skin rashes, tick exposure and other musculoskeletal symptoms. Neurologic symptoms include headaches and weakness migraines; negative for decreased balance, numbness, pain, Seizures, speech problems, tia, tremors, vertigo and other neurologic symptoms. Endocrine symptoms include cold intolerance ; negative for heat intolerance, polydipsia, polyuria, polyphagia, galactorrhea and other glandular complaint. Behavioral: Denies significant mood and psychological symptoms. Hemeatologic: Denies anemia, bleeding / clotting problems and other blood related complaints. Stop Bang interpretation / modified: Low probability of OSA: Score 2 RCRI, no risk factors, risk of cardiac death, nonfatal MI and cardiac arrest, 0.4%. Risk of MI, Pulmonary edema, ventricular fib, cardiac arrest and complete heart block 0.5% POUR / IPSS score 0. Score classified as mild level of symptoms, with no or mild increase in risk of urinary retention. ASA II : Mild systemic disease Charlson score: Enter Burton then select calculate score resulting in estimated in hospital mortality rate of 1.2%     EXAM  BMI 26.22; Temp: ; HR ; BP /; H: '5\' 11"'$ ; Wt 188lb pO2 / RA. Ideal Body weight target below 179.27 lbs  Right knee: Mild varus alignment mild patellofemoral crepitation social minimal joint line tenderness. 5 and half centimeter lateral cyst extending from lateral joint line down to the fibula and approximately other 2 half centimeters onto the femur. peroneal nerve currently intact. General exam   BMI   Cardiac, respiratory and mental status NORMAL      X-RAYS  Date Name Review 10/22/2021 L Knee MR suggestion of a medial meniscus posterior horn subtle tear. grade 4 chondromaalcia patella. 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 lateral ganglion cyst. moderate osteoarhrtis. Review of outside study & report 09/12/2021 Bil KN XR AP STAND Bilateral varus arthritis, Kelligren Larsen grade 2- Mild mild loss of joint space, minor squaring of condyles. No subchondral cysts or sclerosis.     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 We discussed the nature of the cyst. Due to its large protuberance and both symptoms recommend elective 6 incision excision. Advised the proximity to the peroneal nerve th e nerve is at risk with the dissection and he could have foot drop as a result of the procedure.  Due to the large nature of the cyst and likely difficulty in dissection recommend performing the cyst excision as a single procedure in concert with arthroscopy of the knee. Discussed the other findings in addition to the cyst noted on the MRI of the knee. Pain in the knee vitritis or meniscal symptoms would still be present after the surgery. Surgical risk, possible peroneal nerve injury, recurrence and painful scar all discussed. Surgical informed consent is obtained.    Attestation:  Patient has been examined before anesthesia, and there are no changes in medical status or indications for surgery.

## 2022-05-25 NOTE — Nursing Note (Signed)
Time 550

## 2022-05-25 NOTE — Anesthesia Preprocedure Evaluation (Signed)
ANESTHESIA PRE-OP EVALUATION  Planned Procedure: EXCISION OF CYST ON RIGHT KNEE (Right)  Review of Systems     anesthesia history negative               Pulmonary   current smoker,   Cardiovascular    Hypertension, Left ventricular systolic function is normal.  The left ventricular ejection fraction by visual assessment is estimated to be 55-60%.  Abnormal diastolic function, elevated filling pressure.  Mildly dilated left atrium.  There is mild tricuspid regurgitation.    Negative MPS and hyperlipidemia , Exercise Tolerance: > or = 4 METS        GI/Hepatic/Renal    GERD and well controlled        Endo/Other    Factor V Leiden.  Eliquis held per Dr Terance Hart and coagulation disorder,      Neuro/Psych/MS   negative neuro/psych ROS,      Cancer                        Physical Assessment      Airway       Mallampati: II                  Dental           (+) partials           Pulmonary    Breath sounds clear to auscultation       Cardiovascular    Rhythm: regular  Rate: Normal       Other findings              Plan  ASA 2     Planned anesthesia type: general                           Anesthesia issues/risks discussed are: Stroke, Aspiration, Nerve Injuries, Sore Throat, Dental Injuries, Cardiac Events/MI and PONV.  Anesthetic plan and risks discussed with patient             Patient's NPO status is appropriate for Anesthesia.

## 2022-05-25 NOTE — OR Nursing (Signed)
IRRISEPT SOLUTION USED INTRAOP
